# Patient Record
Sex: Male | Born: 1935 | Race: White | Hispanic: No | Marital: Married | Smoking: Former smoker
Health system: Southern US, Community
[De-identification: ages and names within clinical notes are randomized; demographics above are authoritative.]

## PROBLEM LIST (undated history)

## (undated) DIAGNOSIS — E785 Hyperlipidemia, unspecified: Secondary | ICD-10-CM

## (undated) DIAGNOSIS — A809 Acute poliomyelitis, unspecified: Secondary | ICD-10-CM

## (undated) DIAGNOSIS — I10 Essential (primary) hypertension: Secondary | ICD-10-CM

## (undated) DIAGNOSIS — I519 Heart disease, unspecified: Secondary | ICD-10-CM

## (undated) DIAGNOSIS — C4491 Basal cell carcinoma of skin, unspecified: Secondary | ICD-10-CM

## (undated) DIAGNOSIS — E538 Deficiency of other specified B group vitamins: Secondary | ICD-10-CM

## (undated) DIAGNOSIS — R7303 Prediabetes: Secondary | ICD-10-CM

## (undated) DIAGNOSIS — C189 Malignant neoplasm of colon, unspecified: Secondary | ICD-10-CM

## (undated) HISTORY — DX: Basal cell carcinoma of skin, unspecified: C44.91

## (undated) HISTORY — DX: Malignant neoplasm of colon, unspecified: C18.9

## (undated) HISTORY — PX: CARPAL TUNNEL RELEASE: SHX101

## (undated) HISTORY — DX: Essential (primary) hypertension: I10

## (undated) HISTORY — DX: Prediabetes: R73.03

## (undated) HISTORY — DX: Hyperlipidemia, unspecified: E78.5

## (undated) HISTORY — DX: Acute poliomyelitis, unspecified: A80.9

## (undated) HISTORY — DX: Deficiency of other specified B group vitamins: E53.8

## (undated) HISTORY — PX: OTHER SURGICAL HISTORY: SHX169

## (undated) HISTORY — DX: Heart disease, unspecified: I51.9

---

## 2004-12-19 HISTORY — PX: OTHER SURGICAL HISTORY: SHX169

## 2005-08-17 ENCOUNTER — Ambulatory Visit: Payer: Self-pay | Admitting: Internal Medicine

## 2005-10-11 ENCOUNTER — Encounter: Payer: Self-pay | Admitting: Internal Medicine

## 2005-10-19 ENCOUNTER — Encounter: Payer: Self-pay | Admitting: Internal Medicine

## 2005-11-18 ENCOUNTER — Encounter: Payer: Self-pay | Admitting: Internal Medicine

## 2005-12-19 ENCOUNTER — Encounter: Payer: Self-pay | Admitting: Internal Medicine

## 2006-01-19 ENCOUNTER — Encounter: Payer: Self-pay | Admitting: Internal Medicine

## 2008-05-08 ENCOUNTER — Ambulatory Visit: Payer: Self-pay | Admitting: Unknown Physician Specialty

## 2008-05-15 ENCOUNTER — Ambulatory Visit: Payer: Self-pay | Admitting: Unknown Physician Specialty

## 2008-09-18 ENCOUNTER — Ambulatory Visit: Payer: Self-pay | Admitting: Unknown Physician Specialty

## 2008-09-24 ENCOUNTER — Ambulatory Visit: Payer: Self-pay | Admitting: Unknown Physician Specialty

## 2009-08-12 ENCOUNTER — Ambulatory Visit: Payer: Self-pay | Admitting: Internal Medicine

## 2010-12-02 ENCOUNTER — Ambulatory Visit: Payer: Self-pay | Admitting: Gastroenterology

## 2010-12-06 LAB — PATHOLOGY REPORT

## 2011-04-01 ENCOUNTER — Ambulatory Visit: Payer: Self-pay | Admitting: Ophthalmology

## 2011-04-12 ENCOUNTER — Ambulatory Visit: Payer: Self-pay | Admitting: Ophthalmology

## 2011-07-26 ENCOUNTER — Ambulatory Visit: Payer: Self-pay | Admitting: Ophthalmology

## 2012-09-11 ENCOUNTER — Encounter: Payer: Self-pay | Admitting: Family Medicine

## 2012-09-18 ENCOUNTER — Encounter: Payer: Self-pay | Admitting: Family Medicine

## 2012-10-19 ENCOUNTER — Encounter: Payer: Self-pay | Admitting: Family Medicine

## 2012-11-18 ENCOUNTER — Encounter: Payer: Self-pay | Admitting: Family Medicine

## 2013-10-11 ENCOUNTER — Encounter: Payer: Self-pay | Admitting: Family Medicine

## 2013-10-19 ENCOUNTER — Encounter: Payer: Self-pay | Admitting: Family Medicine

## 2013-11-18 ENCOUNTER — Encounter: Payer: Self-pay | Admitting: Family Medicine

## 2014-01-23 ENCOUNTER — Encounter: Payer: Self-pay | Admitting: Neurology

## 2014-02-16 ENCOUNTER — Encounter: Payer: Self-pay | Admitting: Neurology

## 2014-03-28 ENCOUNTER — Encounter: Payer: Self-pay | Admitting: *Deleted

## 2014-03-31 ENCOUNTER — Other Ambulatory Visit: Payer: Self-pay | Admitting: Neurology

## 2014-03-31 ENCOUNTER — Telehealth: Payer: Self-pay | Admitting: *Deleted

## 2014-03-31 ENCOUNTER — Encounter: Payer: Self-pay | Admitting: Neurology

## 2014-03-31 ENCOUNTER — Ambulatory Visit (INDEPENDENT_AMBULATORY_CARE_PROVIDER_SITE_OTHER): Payer: Medicare Other | Admitting: Neurology

## 2014-03-31 VITALS — BP 120/66 | HR 105 | Resp 14 | Wt 144.1 lb

## 2014-03-31 DIAGNOSIS — R279 Unspecified lack of coordination: Secondary | ICD-10-CM

## 2014-03-31 DIAGNOSIS — B91 Sequelae of poliomyelitis: Secondary | ICD-10-CM

## 2014-03-31 DIAGNOSIS — G14 Postpolio syndrome: Secondary | ICD-10-CM

## 2014-03-31 DIAGNOSIS — G609 Hereditary and idiopathic neuropathy, unspecified: Secondary | ICD-10-CM

## 2014-03-31 DIAGNOSIS — G253 Myoclonus: Secondary | ICD-10-CM

## 2014-03-31 DIAGNOSIS — R278 Other lack of coordination: Secondary | ICD-10-CM

## 2014-03-31 DIAGNOSIS — G20 Parkinson's disease: Secondary | ICD-10-CM

## 2014-03-31 LAB — MAGNESIUM: Magnesium: 2 mg/dL (ref 1.5–2.5)

## 2014-03-31 LAB — C-REACTIVE PROTEIN: CRP: 1.8 mg/dL — ABNORMAL HIGH (ref <0.60)

## 2014-03-31 NOTE — Telephone Encounter (Signed)
Pt's wife called back to let me know that pt did have blood drawn.  The lab called me back about another test and I requested the celiac panel.  The lab tech said that he did not have enough blood to add this test.  Do you want him to come back for that lab?

## 2014-03-31 NOTE — Telephone Encounter (Signed)
I called the lab to try to have celiac panel added but they said that they have not received specimen yet.  Attempted to contact pt to see if he had labs drawn yet and had to leave message for him to call me back.  The lab requested that I call them back tomorrow.

## 2014-03-31 NOTE — Patient Instructions (Addendum)
1.  MRI brain wwo contrast 2.  EMG of the left side 3.  Check blood work today 4.  Start sinemet as follows:  - Week 1:  One tablet three times daily  - Week 2:  1.5 tablet in the morning, 1 tablet in the evening and 1 tablet in the evening  - Week 3:  1.5 tablets in the morning, 1.5 tablet in the afternoon, and 1 tablet in the evening  - Week 4:  1.5 tablets three times daily  - Week 5:  2 tablet in the morning, 1.5 tablet in the evening and 1.5 tablet in the evening  - Week 6:  2 tablets in the morning, 2 tablet in the afternoon, and 1.5 tablet in the evening  - Week 7:  2 tablets three times daily 5.  Return 6-weeks   ELECTROMYOGRAM AND NERVE CONDUCTION STUDIES (EMG/NCS) INSTRUCTIONS  How to Prepare The neurologist conducting the EMG will need to know if you have certain medical conditions. Tell the neurologist and other EMG lab personnel if you:   Have a pacemaker or any other electrical medical device   Take blood-thinning medications   Have hemophilia, a blood-clotting disorder that causes prolonged bleeding Bathing Take a shower or bath shortly before your exam in order to remove oils from your skin. Don't apply lotions or creams before the exam.  What to Expect You'll likely be asked to change into a hospital gown for the procedure and lie down on an examination table. The following explanations can help you understand what will happen during the exam.    Electrodes. The neurologist or a technician places surface electrodes at various locations on your skin depending on where you're experiencing symptoms. Or the neurologist may insert needle electrodes at different sites depending on your symptoms.    Sensations. The electrodes will at times transmit a tiny electrical current that you may feel as a twinge or spasm. The needle electrode may cause discomfort or pain that usually ends shortly after the needle is removed. If you are concerned about discomfort or pain, you may want to  talk to the neurologist about taking a short break during the exam.    Instructions. During the needle EMG, the neurologist will assess whether there is any spontaneous electrical activity when the muscle is at rest - activity that isn't present in healthy muscle tissue - and the degree of activity when you slightly contract the muscle.  He or she will give you instructions on resting and contracting a muscle at appropriate times. Depending on what muscles and nerves the neurologist is examining, he or she may ask you to change positions during the exam.  After your EMG You may experience some temporary, minor bruising where the needle electrode was inserted into your muscle. This bruising should fade within several days. If it persists, contact your primary care doctor.      [facility]  Preventing Falls in the Home   Falls are common, often dreaded events in the lives of older people. Aside from the obvious injuries and even death that may result, falls can cause wide-ranging consequences including loss of independence, mental decline, decreased activity, and mobility. Younger people are also at risk of falling, especially those with chronic illnesses and fatigue.  Ways to reduce the risk for falling:  * Examine diet and medications. Warm foods and alcohol dilate blood vessels, which can lead to dizziness when standing. Sleep aids, antidepressants, and pain medications can also increase the  likelihood of a fall.  * Get a vison exam. Poor vision, cataracts, and glaucoma increase the chances of falling.  * Check foot gear. Shoes should fit snugly and have a sturdy, nonskid sole and broad, low heel.  * Participate in a physician-approved exercise program to build and maintain muscle strength and improve balance and coordination.  * Increase vitamin D intake. Vitamin D improves muscle strength and increases the amount of calcium the body is able to absorb and deposit in bones.  How to prevent  falls from common hazards:  * Floors - Remove all loose wires, cords, and throw rugs. Minimize clutter. Make sure rugs are anchored and smooth. Keep furniture in its usual place.  * Chairs - Use chairs with straight backs, armrests, and firm seats. Add firm cushions to existing pieces to add height.  * Bathroom - Install grab bars and non-skid tape in the tub or shower. Use a bathtub transfer bench or a shower chair with a back support. Use an elevated toilet seat and/or safety rails to assist standing from a low surface. Do not use towel racks or bathroom tissue holders to help you stand.  * Lighting - Make sure halls, stairways, and entrances are well-lit. Install a night light in your bathroom or hallway. Make sure there is a light switch at the top and bottom of the staircase. Turn lights on if you get up in the middle of the night. Make sure lamps or light switches are within reach of the bed if you have to get up during the night.  * Kitchen - Install non-skid rubber mats near the sink and stove. Clean spills immediately. Store frequently used utensils, pots, and pans between waist and eye level. This helps prevent reaching and bending. Sit when getting things out of the lower cupboards.  * Living room / [place] furniture with wide spaces in between, giving enough room to move around. Establish a route through the living room that gives you something to hold onto as you walk.  * Stairs - Make sure treads, rails, and rugs are secure. Install a rail on both sides of the stairs. If stairs are a threat, it might be helpful to arrange most of your activities on the lower level to reduce the number of times you must climb the stairs.  * Entrances and doorways - Install metal handles on the walls adjacent to the doorknobs of all doors to make it more secure as you travel through the doorway.  Tips for maintaining balance:  * Keep at least one hand free at all times Try using a backpack or fanny  pack to hold things rather than carrying them in your hands. Never carry objects in both hands when walking as this interferes with keeping your balance.  * Attempt to swing both arms from front to back while walking. This might require a conscious effort if Parkinson's disease has diminished your movement. It will, however, help you to maintain balance and posture, and reduce fatigue.  * Consciously lift your feet off the ground when walking. Shuffling and dragging of the feet is a common culprit in losing your balance.  * When trying to navigate turns, use a "U" technique of facing forward and making a wide turn, rather than pivoting sharply.  Earlean Polka stand with your feet shoulder-length apart. When your feet are close together for any length of time, you increase your risk of losing your balance and falling.  * Do one  thing at a time. Do not try to walk and accomplish another task, such as reading or looking around. The decrease in your automatic reflexes complicates motor function, so the less distraction, the better.  * Do not wear rubber or gripping soled shoes, they might "catch" on the floor and cause tripping.  * Move slowly when changing positions. Use deliberate, concentrated movements and, if needed, use a grab bar or walking aid. Count fifteen (15) seconds after standing to begin walking.  * If balance is a continuous problem, you might want to consider a walking aid such as a cane, walking stick, or walker. Once you have mastered walking with help, you may be ready to try it again on your own.  This information is provided by [facility] and is not intended to replace the medical advice of your physician or other health care providers. Please consult your physician or other health care providers for advice regarding your specific medical condition.

## 2014-03-31 NOTE — Progress Notes (Signed)
[facility] Clinic Note - Initial Visit   Date: 03/31/2014    Richard Blair MRN: [id number] DOB: 03-19-1926   Dear Dr Melrose Nakayama:  Thank you for your kind referral of Richard Blair for consultation of gait difficulty and leg weakness. Although his history is well known to you, please allow [place] to reiterate it for the purpose of our medical record. The patient was accompanied to the clinic by wife and daughter who also provides collateral information.     History of Present Illness: Richard Blair is a 80 y.o. right-handed Caucasian male with history of poliomyelitis with residual left leg weakness, pre-diabetes, hypertension, hyperlipidemia (off statins due to intolerance), CAD s/p CABG x 3 (2006), basal cell carcinoma scalp s/p resection, and colon cancer s/p resection (1984) presenting for evaluation of bilateral leg weakness.    He reports having polio involving the left leg when very he was very young child.  He does not recall what age, but says that he wore a cast intermittently and underwent ankle surgery at the age of 37 for ?foot inversion.  He was eventually able to stop wearing a brace, but always had atrophy and weakness of the left leg. Despite his weakness, he he played football in high school.  He was reportedly doing well until 2011 when he developed left foot drop and needed a left leg brace. In 2013, he started using a cane due to problems with balance, gait, and increased falls.  He did physical therapy in 2013 which helped, again in fall 2014 and early 2015, but due to no improvement, he was discharged.  He was active until last year where he was mowing the lawn with a push mower, but since then has become more sedentary due to gait instability. He is currently ambulating with a wheel chair and feels stable when using it but is very unsteady if he does not have support.  He does not report having falls because of leg buckling, but more so feeling of  unsteadiness.  Denies muscle stiffness, twitches, cramps, pain, or numbness/tingling.   He has fallen once this year, but when symptoms were severe fell 4 times in 6-days.  Most of the time when he falls, he falls backwards.  He cannot stand up from falls and would have to call for help or go to furniture.  No preceding lightheadedness, changes in vision, palpitations, or warning symptoms.    Upon further questioning, he has slowed movements, speech is softer and slightly slurred, intermittent chocking spells with solids and liquids.  There is no changes in handwriting, tremor, constipation, changes in small/taste, hallucinations, vivid creams, or acting out of his dreams.    He has been followed by Dr. Melrose Nakayama and was given a trial of sinemet 25/100 three times per day for one week, but due to no change in symptoms it was stopped.  He is referred to me for further evaluation of leg weakness.    Out-side paper records, electronic medical record, and images have been reviewed where available and summarized as:  US carotids 04/07/2014:  No hemodynamically significant stenosis Labs 09/04/2013:  B12 407 Labs 01/02/2014:  HbA1c 6.3, Cr 0.9, Na 137, AST 14, ALT 12 Labs 01/15/2014:  CK 76, ESR 38, MG panel - neg   Past Medical History  Diagnosis Date  . Polio   . Colon cancer   . Heart disease   . Borderline diabetes   . Hypertension   . Hyperlipidemia   .  Vitamin B12 deficiency   . Basal cell carcinoma     Past Surgical History  Procedure Laterality Date  . Colon cancer surgery    . Triple bypass  2006  . Carpal tunnel release       Medications:  Current Outpatient Prescriptions on File Prior to Visit  Medication Sig Dispense Refill  . aspirin 81 MG tablet Take 81 mg by mouth daily.      . Cyanocobalamin 1000 MCG/ML KIT Inject 1 mg as directed every 30 (thirty) days.      . fluticasone (FLONASE) 50 MCG/ACT nasal spray Place 2 sprays into both nostrils daily.      Marland Kitchen lisinopril  (PRINIVIL,ZESTRIL) 10 MG tablet Take 10 mg by mouth daily.       No current facility-administered medications on file prior to visit.    Allergies:  Allergies  Allergen Reactions  . Aleve [Naproxen Sodium]   . Crestor [Rosuvastatin]   . Lescol [Fluvastatin Sodium]   . Lipitor [Atorvastatin]     Family History: Family History  Problem Relation Age of Onset  . Alzheimer's disease Mother     Deceased, 94  . COPD Father     Deceased, 20  . Dementia Sister     Living, 69  . Heart attack Brother     Deceased  . Diverticulitis Brother     Deceased    Social History: History   Social History  . Marital Status: Married    Spouse Name: N/A    Number of Children: N/A  . Years of Education: N/A   Occupational History  . retired    Social History Main Topics  . Smoking status: Former Smoker -- 1.00 packs/day for 10 years    Types: Cigarettes  . Smokeless tobacco: Not on file     Comment: Quit 40+ years  . Alcohol Use: No  . Drug Use: No  . Sexual Activity: Not on file   Other Topics Concern  . Not on file   Social History Narrative   Lives with wife in a [place] home.  He has three grown children.   Retired as a Gaffer level of education:  High school             Review of Systems:  CONSTITUTIONAL: No fevers, chills, night sweats, or weight loss.   EYES: No visual changes or eye pain ENT: No hearing changes.  No history of nose bleeds.   RESPIRATORY: No cough, wheezing + shortness of breath.   CARDIOVASCULAR: Negative for chest pain, and palpitations.   GI: Negative for abdominal discomfort, blood in stools or black stools.  No recent change in bowel habits.   GU:  No history of incontinence.   MUSCLOSKELETAL: No history of joint pain or swelling.  No myalgias.   SKIN: Negative for lesions, rash, and itching.   HEMATOLOGY/ONCOLOGY: Negative for prolonged bleeding, bruising easily, and swollen nodes.  + history of cancer.     ENDOCRINE: Negative for cold or heat intolerance, polydipsia or goiter.   PSYCH:  No depression or anxiety symptoms.   NEURO: As Above.   Vital Signs:  BP 120/66  Pulse 105  Wt 144 lb 1 oz (65.346 kg)  SpO2 96% Pain Scale: 0 on a scale of 0-10   General Medical Exam:   General:  Somewhat blunted affect, while sitting on the examining table he is leaning back and holding onto the table for support Eyes/ENT: see cranial nerve  examination.   Neck: No masses appreciated.  No carotid bruits. Respiratory:  Clear to auscultation, good air entry bilaterally.   Cardiac:  Regular rate and rhythm, no murmur.   Back:  No pain to palpation of spinous processes.   Extremities:  Left leg atrophy.  No edema.   Skin:  Severe onychomycoses of the feet. No rashes seen, although several areas of skin discoloration over the face from previous surgeries for basal cell carcinoma    Neurological Exam: MENTAL STATUS including orientation to time, place, person, recent and remote memory, attention span and concentration, language, and fund of knowledge is fairly intact.  Speech is slightly dysarthric and hypophonic. He has moderate difficulty with enunciating guttural sounds effectively.   CRANIAL NERVES: II:  No visual field defects.  Unremarkable fundi.   III-IV-VI: Pupils equal round and reactive to light.  Square wave jerks are present bilaterally.  Vertical gaze especially with upward is restricted bilaterally and horizontal gaze is intact. Pursuit is slightly jerky and there is evidence of overshoot with saccadic eye movements. There is no ptosis. V:  Normal facial sensation.  Jaw jerk is absent.   VII:  Normal facial symmetry and movements.  Left palmomental is present and Myerson sign is positive. There is no snout.  VIII:  Normal hearing. IX-X:  Palate elevates symmetrically, however there is prominent palatal myoclonus with audible clicking  XI:  Normal shoulder shrug and head rotation.   XII:   Tongue strength is slightly weak and tongue movements are markedly slowed. There is no evidence of tongue fasciculations.  MOTOR:  Prominent atrophy of the left lower extremity especially below the knee. Rare left vastus medialis fasciculation was seen. There is no tremor. He has a difficult time relaxing however I do believe that there is an increase in tone of the upper extremities which is worse with distraction maneuvers.  Right Upper Extremity:    Left Upper Extremity:    Deltoid  5/5   Deltoid  5/5   Biceps  5/5   Biceps  5/5   Triceps  5/5   Triceps  5/5   Wrist extensors  5/5   Wrist extensors  5/5   Wrist flexors  5/5   Wrist flexors  5/5   Finger extensors  5/5   Finger extensors  5/5   Finger flexors  5/5   Finger flexors  5/5   Dorsal interossei  5/5   Dorsal interossei  5/5   Abductor pollicis  5/5   Abductor pollicis  5/5   Tone (Ashworth scale)  1  Tone (Ashworth scale)  1   Right Lower Extremity:    Left Lower Extremity:    Hip flexors  5-/5   Hip flexors  4/5   Hip extensors  5/5   Hip extensors  5-/5   Abductor  5/5  Abductor  5-/5  Adductor  5/5  Adductor 4+/5  Knee flexors  5/5   Knee flexors  4/5   Knee extensors  5/5   Knee extensors  4+/5   Dorsiflexors  5/5   Dorsiflexors  1/5   Plantarflexors  5/5   Plantarflexors  3/5   Toe extensors  5/5   Toe extensors  0/5   Toe flexors  5/5   Toe flexors  1/5   Tone (Ashworth scale)  0  Tone (Ashworth scale)  0   MSRs:  Right  Left brachioradialis 3+  brachioradialis 3+  Biceps 2+  biceps 2+  triceps 2+  triceps 2+  patellar 1+  patellar 0  ankle jerk trace  ankle jerk 0  Hoffman no  Hoffman no  plantar response down  plantar response down   SENSORY: Pin prick and temperature is reduced distal to the knees bilaterally. Vibration is absent at the knees and distally but intact at the MCP. Proprioception is impaired at the great toe bilaterally.     COORDINATION/GAIT: There is evidence of mild ataxia with finger to nose testing and circumduction of the arms especially on the left. Finger tapping and heel tapping shows slowed movements with decrement. He is unable to rise from a chair without using arms.  Gait is severely ataxic with evidence of truncal ataxia and requiring two-person assist. Steps are small and he is quickly out of breath within 15 feet of walking.    IMPRESSION: Richard Blair is 80 year old gentleman presenting for evaluation of gait abnormality, weakness, and falls. This is a very complex and interesting case with several notable exam findings including: Palatal myoclonus, square wave jerks, increased tone in the upper extremities, dysarthria, distal and symmetric peripheral neuropathy, marked peripheral and axial ataxia, neuropathy and known left leg weakness from history of polio.  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  He has findings suggestive of peripheral neuropathy, central ataxia, and parkinsonian features. The differential is broad and includes neurodegenerative conditions such as Parkinson plus syndromes (progressive supranuclear palsy and other tauopathies), structural lesion involving the Mollaret triangle (cerebellum/pons), toxic-metabolic/nutritional/autoimmune and less likely paraneoplastic processes.    I would like to initiate a extensive workup to better localize and characterize his symptoms. MRI brain will be obtained with and without contrast, EMG of the left side, as well as laboratory testing.  As I continue to work-up symptome, I will optimize his Sinemet to see there is an underlying is a parkinson-plus syndrome.  For post-polio syndrome, management is symptomatic and he reportedly did have improvement with his first cycle of PT in 2013, but no  improvement with his recent session which was completed in March, which further suggests a secondary process going on.    PLAN/RECOMMENDATIONS:  1.  MRI brain wwo contrast 2.  Check copper, ceruloplasmin, CRP, RPR, folate, vitamin E, SPEP/UPEP with IFE, vitamin B6, Mg, heavy metal, celiac panel,  3.  EMG of the left side 4.  Restart Sinemet 25/100 1 tablet 3 times a day and titrate to 2 tablets 3 times a day. Titration schedule has been provided. 5.  Literature on fall precautions provided.  I encouraged him to use his Rolator and continue home exercises. 6.  Return to clinic in 6-weeks   The duration of this appointment visit was 75 minutes of face-to-face time with the patient.  Greater than 50% of this time was spent in counseling, explanation of diagnosis, planning of further management, and coordination of care.   Thank you for allowing me to participate in patient's care.  If I can answer any additional questions, I would be pleased to do so.    Sincerely,    Donika K. Posey Pronto, DO

## 2014-04-01 LAB — RPR

## 2014-04-01 LAB — FOLATE: Folate: 15.4 ng/mL

## 2014-04-01 LAB — TSH: TSH: 4.127 u[IU]/mL (ref 0.350–4.500)

## 2014-04-01 NOTE — Telephone Encounter (Signed)
I spoke with Dr. Posey Pronto and she said it was ok to wait and have lab done when patient returns.

## 2014-04-02 DIAGNOSIS — G14 Postpolio syndrome: Secondary | ICD-10-CM | POA: Insufficient documentation

## 2014-04-02 DIAGNOSIS — G609 Hereditary and idiopathic neuropathy, unspecified: Secondary | ICD-10-CM | POA: Insufficient documentation

## 2014-04-02 DIAGNOSIS — R278 Other lack of coordination: Secondary | ICD-10-CM | POA: Insufficient documentation

## 2014-04-02 DIAGNOSIS — G253 Myoclonus: Secondary | ICD-10-CM | POA: Insufficient documentation

## 2014-04-02 LAB — UIFE/LIGHT CHAINS/TP QN, 24-HR UR
ALBUMIN, U: DETECTED
FREE KAPPA/LAMBDA RATIO: 19.92 ratio — AB (ref 2.04–10.37)
FREE LAMBDA LT CHAINS, UR: 0.12 mg/dL (ref 0.02–0.67)
Free Kappa Lt Chains,Ur: 2.39 mg/dL (ref 0.14–2.42)
TOTAL PROTEIN, URINE-UPE24: 4 mg/dL

## 2014-04-02 LAB — CERULOPLASMIN: CERULOPLASMIN: 25 mg/dL (ref 18–36)

## 2014-04-02 NOTE — Progress Notes (Signed)
Note faxed.

## 2014-04-03 LAB — SPEP & IFE WITH QIG
ALBUMIN ELP: 56.5 % (ref 55.8–66.1)
ALPHA-2-GLOBULIN: 11.6 % (ref 7.1–11.8)
Alpha-1-Globulin: 5.8 % — ABNORMAL HIGH (ref 2.9–4.9)
BETA 2: 4.6 % (ref 3.2–6.5)
BETA GLOBULIN: 5.8 % (ref 4.7–7.2)
Gamma Globulin: 15.7 % (ref 11.1–18.8)
IgA: 247 mg/dL (ref 68–379)
IgG (Immunoglobin G), Serum: 1160 mg/dL (ref 650–1600)
IgM, Serum: 35 mg/dL — ABNORMAL LOW (ref 41–251)
TOTAL PROTEIN, SERUM ELECTROPHOR: 6.7 g/dL (ref 6.0–8.3)

## 2014-04-03 LAB — COPPER, SERUM: Copper: 91 ug/dL (ref 70–175)

## 2014-04-04 LAB — VITAMIN E
GAMMA-TOCOPHEROL (VIT E): 2.2 mg/L (ref <=4.3)
VITAMIN E (ALPHA TOCOPHEROL): 9.1 mg/L (ref 5.7–19.9)

## 2014-04-05 LAB — VITAMIN B6: VITAMIN B6: 7.7 ng/mL (ref 2.1–21.7)

## 2014-04-07 NOTE — Telephone Encounter (Signed)
abc

## 2014-04-09 ENCOUNTER — Other Ambulatory Visit: Payer: Self-pay | Admitting: *Deleted

## 2014-04-09 DIAGNOSIS — G609 Hereditary and idiopathic neuropathy, unspecified: Secondary | ICD-10-CM

## 2014-04-11 ENCOUNTER — Ambulatory Visit (HOSPITAL_COMMUNITY)
Admission: RE | Admit: 2014-04-11 | Discharge: 2014-04-11 | Disposition: A | Discharge status: Home / Self Care | Payer: Medicare Other | Source: Ambulatory Visit | Attending: Neurology | Admitting: Neurology

## 2014-04-11 DIAGNOSIS — R42 Dizziness and giddiness: Secondary | ICD-10-CM | POA: Insufficient documentation

## 2014-04-11 DIAGNOSIS — G319 Degenerative disease of nervous system, unspecified: Secondary | ICD-10-CM | POA: Insufficient documentation

## 2014-04-11 DIAGNOSIS — I6529 Occlusion and stenosis of unspecified carotid artery: Secondary | ICD-10-CM | POA: Insufficient documentation

## 2014-04-11 DIAGNOSIS — G609 Hereditary and idiopathic neuropathy, unspecified: Secondary | ICD-10-CM

## 2014-04-11 DIAGNOSIS — G253 Myoclonus: Secondary | ICD-10-CM | POA: Insufficient documentation

## 2014-04-11 DIAGNOSIS — I739 Peripheral vascular disease, unspecified: Secondary | ICD-10-CM | POA: Insufficient documentation

## 2014-04-11 DIAGNOSIS — R279 Unspecified lack of coordination: Secondary | ICD-10-CM | POA: Insufficient documentation

## 2014-04-11 DIAGNOSIS — R4789 Other speech disturbances: Secondary | ICD-10-CM | POA: Insufficient documentation

## 2014-04-11 LAB — CREATININE, SERUM
Creatinine, Ser: 0.84 mg/dL (ref 0.50–1.35)
GFR calc non Af Amer: 77 mL/min — ABNORMAL LOW (ref >90)
GFR, EST AFRICAN AMERICAN: 89 mL/min — AB (ref >90)

## 2014-04-11 LAB — HEAVY METALS SCREEN, URINE
ARSENIC 24H UR: 3
Lead, Urine (24 Hr): 1
Mercury 24 Hr Urine: <2

## 2014-04-11 MED ORDER — GADOBENATE DIMEGLUMINE 529 MG/ML IV SOLN
15.0000 mL | Freq: Once | INTRAVENOUS | Status: AC
  Administered 2014-04-11: 15 mL via INTRAVENOUS

## 2014-04-30 ENCOUNTER — Encounter: Payer: Self-pay | Admitting: Neurology

## 2014-05-01 ENCOUNTER — Telehealth: Payer: Self-pay | Admitting: Neurology

## 2014-05-01 NOTE — Telephone Encounter (Signed)
Called and discussed results of labs and MRI/A brain.  He has significant intracranial atherosclerosis.  He is already taking aspirin and has not tolerated statins in the past.  He is seeing his PCP for hyperlipidemia. All questions were answered.  Donika K. Posey Pronto, DO

## 2014-05-13 ENCOUNTER — Telehealth: Payer: Self-pay | Admitting: Neurology

## 2014-05-13 MED ORDER — CARBIDOPA-LEVODOPA 25-100 MG PO TABS: 2.0000 | ORAL_TABLET | Freq: Three times a day (TID) | ORAL | Status: DC

## 2014-05-13 NOTE — Telephone Encounter (Signed)
Patient's wife notified.  Patient has follow up appointment on June 4.

## 2014-05-13 NOTE — Telephone Encounter (Signed)
Please notify pt that sinemet 25/100 2 tab three times daily has been sent (per faxed rx request).  I would like to see him again before changing the dose of the medication.  Chirag Krueger K. Posey Pronto, DO

## 2014-05-22 ENCOUNTER — Telehealth: Payer: Self-pay | Admitting: Neurology

## 2014-05-22 ENCOUNTER — Ambulatory Visit (INDEPENDENT_AMBULATORY_CARE_PROVIDER_SITE_OTHER): Payer: Medicare Other | Admitting: Neurology

## 2014-05-22 ENCOUNTER — Encounter: Payer: Self-pay | Admitting: Neurology

## 2014-05-22 VITALS — BP 104/60 | HR 84 | Resp 14 | Ht 64.0 in | Wt 143.0 lb

## 2014-05-22 DIAGNOSIS — B91 Sequelae of poliomyelitis: Secondary | ICD-10-CM

## 2014-05-22 DIAGNOSIS — G238 Other specified degenerative diseases of basal ganglia: Secondary | ICD-10-CM

## 2014-05-22 DIAGNOSIS — G14 Postpolio syndrome: Secondary | ICD-10-CM

## 2014-05-22 DIAGNOSIS — G20C Parkinsonism, unspecified: Secondary | ICD-10-CM

## 2014-05-22 DIAGNOSIS — G232 Striatonigral degeneration: Secondary | ICD-10-CM

## 2014-05-22 NOTE — Patient Instructions (Addendum)
1.  Continue sinemet 25/100mg  2 tab twice daily 2.  EMG scheduled on 06/05/2014 at 3pm.   Please arrive 30-min prior to appointment. 3.  Return to clinic in 6 weeks

## 2014-05-22 NOTE — Telephone Encounter (Signed)
Sinimet 25/100 2 tablets three times daily.  Please convey that I apologize for confusion.  Donika K. Posey Pronto, DO

## 2014-05-22 NOTE — Telephone Encounter (Signed)
Richard Blair called and said that your instructions say to take Sinemet bid but he had worked up to tid.  Does he need to stay on it tid?  Please advise.  Thanks.

## 2014-05-22 NOTE — Progress Notes (Signed)
Follow-up Visit   Date: 05/22/2014    Richard Blair MRN: [id number] DOB: [birth year]   Interim History: Richard Blair is a 80 y.o. right-handed Caucasian male with history of history of poliomyelitis with residual left leg weakness, pre-diabetes, hypertension, hyperlipidemia (off statins due to intolerance), CAD s/p CABG x 3 (2006), basal cell carcinoma scalp s/p resection, and colon cancer s/p resection (1984) returning to the clinic for follow-up of gait ataxia and leg weakness.  The patient was accompanied to the clinic by daughter and wife who also provides collateral information.    History of present illness: He reports having polio involving the left leg when very he was very young child. He does not recall what age, but says that he wore a cast intermittently and underwent ankle surgery at the age of 64 for ?foot inversion. He was eventually able to stop wearing a brace, but always had atrophy and weakness of the left leg. Despite his weakness, he he played football in high school.   He was doing well until 2011 when he developed left foot drop and needed a left leg brace. In 2013, he started using a cane due to problems with balance, gait, and increased falls. He did physical therapy in 2013 which helped, again in fall 2014 and early 2015, but due to no improvement, he was discharged. He was active until last year where he was mowing the lawn with a push mower, but since then has become more sedentary due to gait instability. He is currently ambulating with a rollator and feels stable when using it but is very unsteady if he does not have support.  He has fallen once this year, but when symptoms were severe fell 4 times in 6-days. Most of the time when he falls, he falls backwards. He cannot stand up from falls and would have to call for help or go to furniture. No preceding lightheadedness, changes in vision, palpitations, or warning symptoms.   Upon further questioning, he has slowed  movements, speech is softer and slightly slurred, intermittent chocking spells with solids and liquids. There is no changes in handwriting, tremor, constipation, changes in small/taste, hallucinations, vivid creams, or acting out of his dreams.   He has been followed by Dr. Melrose Nakayama and was given a trial of sinemet 25/100 three times per day for one week, but due to no change in symptoms it was stopped.   - Follow-up 05/22/2014:  At his last visit sinemet 25/100 was started and he recently increased it to 2 tablets twice daily. Richard Blair does not notice any significant difference, but his daughter and wife say that it has helped.  For instance, he fell forwards trying to pick something up off the floor and family say that he did not require as much help to get up as before.  Since March, he has only fallen twice which is improved.  He feels that his gait is more stable after using the walker and being more cautious.     Medications:  Current Outpatient Prescriptions on File Prior to Visit  Medication Sig Dispense Refill  . aspirin 81 MG tablet Take 81 mg by mouth daily.      . carbidopa-levodopa (SINEMET IR) 25-100 MG per tablet Take 2 tablets by mouth 3 (three) times daily.  200 tablet  5  . fluticasone (FLONASE) 50 MCG/ACT nasal spray Place 2 sprays into both nostrils daily.      Marland Kitchen lisinopril (PRINIVIL,ZESTRIL) 10 MG tablet Take  10 mg by mouth daily.       No current facility-administered medications on file prior to visit.    Allergies:  Allergies  Allergen Reactions  . Aleve [Naproxen Sodium]   . Crestor [Rosuvastatin]   . Lescol [Fluvastatin Sodium]   . Lipitor [Atorvastatin]      Review of Systems:  CONSTITUTIONAL: No fevers, chills, night sweats, or weight loss.   EYES: No visual changes or eye pain ENT: No hearing changes.  No history of nose bleeds.   RESPIRATORY: No cough, wheezing and shortness of breath.   CARDIOVASCULAR: Negative for chest pain, and palpitations.   GI:  Negative for abdominal discomfort, blood in stools or black stools.  No recent change in bowel habits.   GU:  No history of incontinence.   MUSCLOSKELETAL: No history of joint pain or swelling.  No myalgias.   SKIN: Negative for lesions, rash, and itching.   ENDOCRINE: Negative for cold or heat intolerance, polydipsia or goiter.   PSYCH:  No depression or anxiety symptoms.   NEURO: As Above.   Vital Signs:  BP 104/60  Pulse 84  Resp 14  Ht _0  (1.626 m)  Wt 143 lb (64.864 kg)  BMI 24.53 kg/m2  Neurological Exam: MENTAL STATUS including orientation to time, place, person, recent and remote memory, attention span and concentration, language, and fund of knowledge is normal.  Speech is not dysarthric.  CRANIAL NERVES: No visual field defects. Pupils equal round and reactive to light. Square wave jerks are absent today. Vertical gaze paresis bilaterally especially with upward gaze.  Horizontal gaze is intact. Pursuit is slightly jerky and there is evidence of overshoot with saccadic eye movements. There is no ptosis. Face is symmetric. Palate elevates symmetrically.  Tongue is midline.  Tongue movements are moderately slowed and there is slight weakness.  No tongue fasciculations. Left palmomental is present and Myerson sign is positive. There is no snout.   MOTOR:  Motor strength is 5/5 upper extremities, see below table for lower extremities (hip flexion is improved).  Prominent atrophy of the left lower extremity especially below the knee. Tone is normal (improved).  Right Lower Extremity:    Left Lower Extremity:    Hip flexors  5/5   Hip flexors  5/5   Hip extensors  5/5   Hip extensors  5-/5   Abductor  5/5   Abductor  5/5   Adductor  5/5   Adductor  5/5   Knee flexors  5/5   Knee flexors  4+/5   Knee extensors  5/5   Knee extensors  5-/5   Dorsiflexors  5/5   Dorsiflexors  1/5   Plantarflexors  5/5   Plantarflexors  3/5   Toe extensors  5/5   Toe extensors  0/5   Toe flexors   5/5   Toe flexors  1/5   Tone (Ashworth scale)  0   Tone (Ashworth scale)  0     MSRs:  Right      Left  brachioradialis  3+   brachioradialis  3+   Biceps  2+   biceps  2+   triceps  2+   triceps  2+   patellar  1+   patellar  0   ankle jerk  trace   ankle jerk  0   Hoffman  no   Hoffman  no   plantar response  down   plantar response  down    SENSORY: Pin prick and temperature  is reduced distal to the knees bilaterally. Vibration is absent at the knees and distally but intact at the MCP. Proprioception is impaired at the great toe bilaterally.   COORDINATION/GAIT: Minimal dysmetria with finger to nose testing (improved). Finger tapping and heel tapping shows slowed movements with decrement. He is unable to rise from a chair without using arms and it takes several attempts. Gait is moderately ataxic and assisted with rollator.  There is no trunal ataxia and he is able to get up by himself (previously required two-person assist).  He veers to the right when walking, small steps, no shuffling, walks faster than would be expected for the degree of instability.   Data: Labs 09/04/2013: B12 407  Labs 01/02/2014: HbA1c 6.3, Cr 0.9, Na 137, AST 14, ALT 12  Chol 143  TH 75  LDL 90.9, HDL 37 Labs 01/15/2014: CK 76, ESR 38, MG panel - neg Labs 04/02/2014:  Heavy metal screen neg, Mg 2.0, vitamin B6 7.7, copper 91, ceruloplasmin 25, RPR neg, CRP 1.8, folate 15.4, vitamin E 2.2, SPEP/UPEP with IFE no M protein, TSH 4.127  US carotids 04/07/2014: No hemodynamically significant stenosis   MRI/A brain wwo contrast 04/11/2014:  Atrophy and small vessel disease. No acute intracranial findings. No abnormal postcontrast enhancement. Potentially flow reducing 75% supraclinoid left ICA stenosis. No evidence for vertebrobasilar insufficiency.    IMPRESSION/PLAN: Mr. Kinker is 80 year old gentleman with history of polio affecting the left lower leg returns for follow-up of gait abnormality, weakness, and falls. His  examination today shows marked improvement in truncal ataxia, absence of square wave jerks, improved dysmetria, and improved upper extremity tone. Proximal motor strength is also improved.  He continues to have palatal myotonus, soft voice, peripheral sensory loss, and gait ataxia.  He underwent extensive work-up including MRI/A brain as well as serological testing which has largely been non-diagnostic. He has LICA supraclinoid stenosis and I recommended that he continue aspirin 63m.  His LDL is 90 and has history of statin intolerance, so is current not on medications.  Although he denies significant improvement, his clinical exam shows marked improvement after starting sinemet.  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    At this time, I recommend continuing his current dose of sinemet 25/100 2 tablets three times daily.  To determine the degree to which neuropathy may be contributing to his ataxia, EMG has been ordered. I also encouraged him to continue home exercises, specifically doing Big and Loud exercises.  I would like him to restart therapy now that sinemet has shown some clinical improvement.  Discussed obtaining baseline MBS for symptoms of dysphagia, but patient declined.  I will see him back in 170-monthor sooner as needed   The duration of this appointment visit was 30 minutes of face-to-face time with the patient.  Greater than 50% of this time was spent in counseling, explanation of diagnosis, planning of further management, and coordination of care.   Thank you for allowing me to participate in patient's care.  If I can answer any additional questions, I would be pleased to do so.    Sincerely,    Quentavious Rittenhouse K. PaPosey ProntoDO

## 2014-05-22 NOTE — Telephone Encounter (Signed)
Pt's spouse called requesting to speak to a nurse regarding his meds instructions.

## 2014-05-23 NOTE — Telephone Encounter (Signed)
Done

## 2014-06-05 ENCOUNTER — Ambulatory Visit (INDEPENDENT_AMBULATORY_CARE_PROVIDER_SITE_OTHER): Payer: Medicare Other | Admitting: Neurology

## 2014-06-05 DIAGNOSIS — G253 Myoclonus: Secondary | ICD-10-CM

## 2014-06-05 NOTE — Procedures (Signed)
[facility]  [address], [address]  [place], [place] [address] Tel: [phone] Fax:  [phone] Test Date:  06/05/2014  Patient: Richard Blair DOB: 03/08/2026 Physician: Narda Amber, DO  Sex: Male Height: 5' " Ref Phys: Narda Amber  ID#: [id number] Temp: 343.0C Technician: Laureen Ochs R. NCS T.   Patient Complaints: Patient is an 80 year old male with history of poliomyelitis affecting the left leg here for evaluation of gait ataxia and falls.  NCV & EMG Findings: Extensive electrodiagnostic testing of the right upper and lower extremity with additional studies of the left reveals:  1. In the arms, the median and radial sensory responses show reduced amplitude (R12.9, R13.8 V). The ulnar sensory response is normal.  The ulnar and median motor responses are within normal limits. 2. In the legs, the sural and superficial peroneal sensory responses are absent bilaterally. The peroneal motor response is absent at the extensor digitorum brevis and barely present on the left and reduced on the right. Tibial motor responses are preserved bilaterally.  3. In the right lower extremity, chronic motor axon loss changes are seen affecting all the muscles of the lower extremity.  4. In the left lower extremity, no motor unit were seen despite maximal activation in the tibialis anterior, medial gastrocnemius, flexor digitorum longus, and gluteus medius. Chronic motor axonal loss changes were seen affecting the rectus femoris muscle without accompanied active denervation. In particular, giant motor unit potentials are not seen. 5. In the right upper extremity, chronic motor axonal loss changes are seen involving the distal hand muscles without active denervation.  Impression: 1. There is electrophysiologic evidence of a generalized sensorimotor polyneuropathy, axon loss in type, affecting the right upper and lower extremity, conforming to a gradient pattern. Overall, these findings are  moderately severe in degree electrically. 2. Severe chronic motor axon loss changes are seen affecting the left lower extremity suggestive of an intraspinal canal lesion (anterior horn cell, radiculopathy, etc) affecting the L3-S1 nerve roots/segments.  These findings are most likely reflect sequela of known poliomyelitis. 3. A superimposed multilevel intraspinal canal lesion process affecting the right L3-L5 nerve roots/segments cannot be excluded.   ___________________________ Narda Amber, DO    Nerve Conduction Studies Anti Sensory Summary Table   Site NR Peak (ms) Norm Peak (ms) P-T Amp (V) Norm P-T Amp  Right Median Anti Sensory (2nd Digit)  Wrist    3.5 <3.7 12.9 >20  Right Radial Anti Sensory (Base 1st Digit)  Wrist    2.3 <2.7 13.8 >20  Left Sup Peroneal Anti Sensory (Ant Lat Mall)  12 cm NR  <4  >6  Right Sup Peroneal Anti Sensory (Ant Lat Mall)  12 cm NR  <4  >6  Left Sural Anti Sensory (Lat Mall)  Calf NR  <4.4  >6  Right Sural Anti Sensory (Lat Mall)  Calf NR  <4.4  >6  Right Ulnar Anti Sensory (5th Digit)  Wrist    2.9 <3.5 17.6 >10   Motor Summary Table   Site NR Onset (ms) Norm Onset (ms) O-P Amp (mV) Norm O-P Amp Site1 Site2 Delta-0 (ms) Dist (cm) Vel (m/s) Norm Vel (m/s)  Right Median Motor (Abd Poll Brev)  Wrist    4.0 <4.4 9.7 >4 Elbow Wrist 4.6 27.0 59 >49  Elbow    8.6  9.3         Left Peroneal Motor (Ext Dig Brev)  Ankle NR  <5.5  >2  Right Peroneal Motor (Ext Dig Brev)  Ankle NR  <5.5  >2        Left Peroneal TA Motor (Tib Ant)  Fib Head    4.1 <4.2 0.4  Poplit Fib Head 2.5 10.0 40 >40.5  Poplit    6.6 <5.7 0.3         Right Peroneal TA Motor (Tib Ant)  Fib Head    3.3 <4.2 2.3  Poplit Fib Head 1.4 8.5 61 >40.5  Poplit    4.7 <5.7 2.3         Left Tibial Motor (Abd Hall Brev)  Ankle    5.7 <6.1 7.8 >3.0 Knee Ankle 9.3 35.0 38 >41  Knee    15.0  4.5         Right Tibial Motor (Abd Hall Brev)  Ankle    4.5 <6.1 4.5 >3.0 Knee Ankle 7.1  36.0 51 >41  Knee    11.6  3.1         Right Ulnar Motor (Abd Dig Minimi)  Wrist    2.7 <3.5 8.8 >6 B Elbow Wrist 4.1 24.0 59 >49  B Elbow    6.8  8.8  A Elbow B Elbow 1.9 10.0 53 >49  A Elbow    8.7  8.5  Axilla A Elbow 5.1 0.0     EMG   Side Muscle Ins Act Fibs Psw Fasc Number Recrt Dur Dur. Amp Amp. Poly Poly. Comment  Left AntTibialis Nml Nml Nml Nml None None - - - - - - N/A  Left Gastroc Nml Nml Nml Nml None None - - - - - - N/A  Left Flex Dig Long Nml Nml Nml Nml None None - - - - - - N/A  Left RectFemoris Nml Nml Nml Nml 3- Rapid Many 1+ Many 1+ Nml Nml N/A  Left GluteusMed Nml Nml Nml Nml [facility] None - - - - - - N/A  Right AntTibialis Nml Nml Nml Nml 2- Rapid Many 1+ Many 1+ Nml Nml N/A  Right Gastroc Nml Nml Nml Nml 1- Mod-V Few 1+ Nml Nml Nml Nml N/A  Right Flex Dig Long Nml Nml Nml Nml 2- Rapid Many 1+ Many 1+ Nml Nml N/A  Right RectFemoris Nml Nml Nml Nml 1- Mod-R Some 1+ Nml Nml Nml Nml N/A  Right GluteusMed Nml Nml Nml Nml 1- Mod-R Many 1+ Nml Nml Nml Nml N/A  Right 1stDorInt Nml Nml Nml Nml 1- Rapid Some 1+ Some 1+ Nml Nml N/A  Right Ext Indicis Nml Nml Nml Nml 1- Rapid Some 1+ Some 1+ Nml Nml N/A  Right FlexPolLong Nml Nml Nml Nml 1- Mod-R Few 1+ Nml Nml Nml Nml N/A  Right Triceps Nml Nml Nml Nml Nml Nml Nml Nml Nml Nml Nml Nml N/A  Right Abd Poll Brev Nml Nml Nml Nml 1- Mod-R Few 1+ Nml Nml Nml Nml N/A      Waveforms:

## 2014-07-08 ENCOUNTER — Encounter: Payer: Self-pay | Admitting: Neurology

## 2014-07-08 ENCOUNTER — Ambulatory Visit (INDEPENDENT_AMBULATORY_CARE_PROVIDER_SITE_OTHER): Payer: Medicare Other | Admitting: Neurology

## 2014-07-08 VITALS — BP 130/74 | HR 84 | Ht 64.96 in | Wt 141.4 lb

## 2014-07-08 DIAGNOSIS — G232 Striatonigral degeneration: Secondary | ICD-10-CM

## 2014-07-08 DIAGNOSIS — G14 Postpolio syndrome: Secondary | ICD-10-CM

## 2014-07-08 DIAGNOSIS — B91 Sequelae of poliomyelitis: Secondary | ICD-10-CM

## 2014-07-08 DIAGNOSIS — G238 Other specified degenerative diseases of basal ganglia: Secondary | ICD-10-CM

## 2014-07-08 DIAGNOSIS — G253 Myoclonus: Secondary | ICD-10-CM

## 2014-07-08 DIAGNOSIS — G609 Hereditary and idiopathic neuropathy, unspecified: Secondary | ICD-10-CM

## 2014-07-08 NOTE — Progress Notes (Signed)
Follow-up Visit   Date: 07/08/2014    Richard Blair MRN: [id number] DOB: 07/11/1926   Interim History: Richard Blair is a 80 y.o. right-handed Caucasian male with history of history of poliomyelitis with residual left leg weakness, pre-diabetes, hypertension, hyperlipidemia (off statins due to intolerance), CAD s/p CABG x 3 (2006), basal cell carcinoma scalp s/p resection, and colon cancer s/p resection (1984) returning to the clinic for follow-up of parksinsonism, gait ataxia, and leg weakness.  The patient was accompanied to the clinic by daughter who also provides collateral information.    History of present illness: He reports having polio involving the left leg when very he was very young child. He does not recall what age, but says that he wore a cast intermittently and underwent ankle surgery at the age of 7 for ?foot inversion. He was eventually able to stop wearing a brace, but always had atrophy and weakness of the left leg. Despite his weakness, he he played football in high school.   He was doing well until 2011 when he developed left foot drop and needed a left leg brace. In 2013, he started using a cane due to problems with balance, gait, and increased falls. He did physical therapy in 2013 which helped, again in fall 2014 and early 2015, but due to no improvement, he was discharged. He was active until last year where he was mowing the lawn with a push mower, but since then has become more sedentary due to gait instability. He is currently ambulating with a rollator and feels stable when using it but is very unsteady if he does not have support.  He has fallen once this year, but when symptoms were severe fell 4 times in 6-days. Most of the time when he falls, he falls backwards. He cannot stand up from falls and would have to call for help or go to furniture. No preceding lightheadedness, changes in vision, palpitations, or warning symptoms.   Upon further questioning,  he has slowed movements, speech is softer and slightly slurred, intermittent chocking spells with solids and liquids. There is no changes in handwriting, tremor, constipation, changes in small/taste, hallucinations, vivid creams, or acting out of his dreams.   He has been followed by Dr. Melrose Nakayama and was given a trial of sinemet 25/100 three times per day for one week, but due to no change in symptoms it was stopped.   - Follow-up 05/22/2014:  At his last visit sinemet 25/100 was started and he recently increased it to 2 tablets twice daily. Richard Blair does not notice any significant difference, but his daughter and wife say that it has helped.  For instance, he fell forwards trying to pick something up off the floor and family say that he did not require as much help to get up as before.  Since March, he has only fallen twice which is improved.    UPDATE 07/08/2014:  He underwent EMG of the left arm and leg which showed moderately severe length-dependent sensorimotor polyneuropathy as well as changes consistent with poliomyelitis involving the left leg.  He does not feel any significant improvement, since being on sinemet, but his wife feels that he is stronger.  Last week, he was able to walk about 12 feet without the walker, which he had not done in the past 26-months. He also mowed his lawn on a riding mower after his son helped him onto it, but felt quite tired by the end of it.  He  has lost about 3lb since April 2015 and attributes this to poor appetite.  He denies any problems with swallowing.     Medications:  Current Outpatient Prescriptions on File Prior to Visit  Medication Sig Dispense Refill  . aspirin 81 MG tablet Take 81 mg by mouth daily.      . carbidopa-levodopa (SINEMET IR) 25-100 MG per tablet Take 2 tablets by mouth 3 (three) times daily.  200 tablet  5  . Cholecalciferol (VITAMIN D-3) 1000 UNITS CAPS Take by mouth daily.      . fluticasone (FLONASE) 50 MCG/ACT nasal spray Place 2 sprays  into both nostrils daily.      Marland Kitchen lisinopril (PRINIVIL,ZESTRIL) 10 MG tablet Take 10 mg by mouth daily.      . vitamin B-12 (CYANOCOBALAMIN) 1000 MCG tablet Take 1,000 mcg by mouth daily.       No current facility-administered medications on file prior to visit.    Allergies:  Allergies  Allergen Reactions  . Aleve [Naproxen Sodium]   . Crestor [Rosuvastatin]   . Lescol [Fluvastatin Sodium]   . Lipitor [Atorvastatin]      Review of Systems:  CONSTITUTIONAL: No fevers, chills, night sweats, or weight loss.   EYES: No visual changes or eye pain ENT: No hearing changes.  No history of nose bleeds.   RESPIRATORY: No cough, wheezing and shortness of breath.   CARDIOVASCULAR: Negative for chest pain, and palpitations.   GI: Negative for abdominal discomfort, blood in stools or black stools.  No recent change in bowel habits.   GU:  No history of incontinence.   MUSCLOSKELETAL: No history of joint pain or swelling.  No myalgias.   SKIN: Negative for lesions, rash, and itching.   ENDOCRINE: Negative for cold or heat intolerance, polydipsia or goiter.   PSYCH:  No depression or anxiety symptoms.   NEURO: As Above.   Vital Signs:  BP 130/74  Pulse 84  Ht 5' 4.96" (1.65 m)  Wt 141 lb 6 oz (64.127 kg)  BMI 23.55 kg/m2  SpO2 95%  Neurological Exam: MENTAL STATUS including orientation to time, place, person, recent and remote memory, attention span and concentration, language, and fund of knowledge is normal.  Speech is soft and slightly hypophonic.  CRANIAL NERVES:   Pupils equal round and reactive to light. No square wave jerks. Vertical gaze paresis bilaterally especially with upward gaze.  Horizontal gaze is intact. Pursuit is slightly jerky and there is evidence of overshoot with saccadic eye movements. There is no ptosis. Face is symmetric. Palate elevates symmetrically. Palatal myoclonus present.  Tongue is midline.  Tongue movements are moderately slowed and there is slight  weakness.  No tongue fasciculations. Left palmomental is present and Myerson sign is positive. There is no snout.   MOTOR:  Motor strength is 5/5 upper extremities, see below table for lower extremities (hip flexion is improved).  Prominent atrophy of the left lower extremity especially below the knee. Tone is normal (improved).  Right Lower Extremity:    Left Lower Extremity:    Hip flexors  5/5   Hip flexors  5/5   Hip extensors  5/5   Hip extensors  5-/5   Abductor  5/5   Abductor  5/5   Adductor  5/5   Adductor  5/5   Knee flexors  5/5   Knee flexors  4+/5   Knee extensors  5/5   Knee extensors  5-/5   Dorsiflexors  5/5   Dorsiflexors  1/5  Plantarflexors  5/5   Plantarflexors  3/5   Toe extensors  5/5   Toe extensors  0/5   Toe flexors  5/5   Toe flexors  1/5   Tone (Ashworth scale)  0   Tone (Ashworth scale)  0     MSRs:  Right      Left  brachioradialis  3+   brachioradialis  3+   Biceps  2+   biceps  2+   triceps  2+   triceps  2+   patellar  1+   patellar  0   ankle jerk  trace   ankle jerk  0   Hoffman  no   Hoffman  no   plantar response  down   plantar response  down    SENSORY: Pin prick and temperature is reduced distal to the knees bilaterally. Vibration is absent at the knees and distally but intact at the MCP. Proprioception is impaired at the great toe bilaterally.   COORDINATION/GAIT: Minimal dysmetria with finger to nose testing (improved). Finger tapping and heel tapping shows slowed movements with decrement. He is unable to rise from a chair without using arms and it takes several attempts. Gait is moderately ataxic and assisted with rollator.  There is no trunal ataxia and he is able to get up by himself (previously required two-person assist).  He veers to the right when walking, small steps, no shuffling, walks faster than would be expected for the degree of instability.   Data: Labs 09/04/2013: B12 407  Labs 01/02/2014: HbA1c 6.3, Cr 0.9, Na 137, AST 14,  ALT 12  Chol 143  TH 75  LDL 90.9, HDL 37 Labs 01/15/2014: CK 76, ESR 38, MG panel - neg Labs 04/02/2014:  Heavy metal screen neg, Mg 2.0, vitamin B6 7.7, copper 91, ceruloplasmin 25, RPR neg, CRP 1.8, folate 15.4, vitamin E 2.2, SPEP/UPEP with IFE no M protein, TSH 4.127  US carotids 04/07/2014: No hemodynamically significant stenosis   MRI/A brain wwo contrast 04/11/2014:  Atrophy and small vessel disease. No acute intracranial findings. No abnormal postcontrast enhancement. Potentially flow reducing 75% supraclinoid left ICA stenosis. No evidence for vertebrobasilar insufficiency.   EMG 06/05/2014: 1. There is electrophysiologic evidence of a generalized sensorimotor polyneuropathy, axon loss in type, affecting the right upper and lower extremity, conforming to a gradient pattern. Overall, these findings are moderately severe in degree electrically. 2. Severe chronic motor axon loss changes are seen affecting the left lower extremity suggestive of an intraspinal canal lesion (anterior horn cell, radiculopathy, etc) affecting the L3-S1 nerve roots/segments. These findings are most likely reflect sequela of known poliomyelitis. 3. A superimposed multilevel intraspinal canal lesion process affecting the right L3-L5 nerve roots/segments cannot be excluded.    IMPRESSION/PLAN: Richard Blair is 80 year old gentleman with history of polio affecting the left lower leg returns for follow-up of gait abnormality, weakness, and falls. His examination appears relatively stable from his last office visit. He continues to have palatal myotonus, soft voice, left leg weakness, peripheral sensory loss, and gait ataxia.  He underwent extensive work-up including MRI/A brain as well as serological testing which has largely been non-diagnostic. He has LICA supraclinoid stenosis and I recommended that he continue aspirin $RemoveBeforeDE'81mg'aPVJBpjYrYzSEng$ .  His LDL is 90 and has history of statin intolerance, so is current not on medications.  Although he  denies significant improvement, his clinical exam showed marked improvement after starting sinemet especially with truncal ataxia and tone, however gait ataxia remains problematic and prominent.  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    His EMG shows moderately severe length-dependent peripheral neuropathy, likely idiopathic, which is undoubtedly also contributing to his gait ataxia.  Unfortunately, extensive work-up looking for treatable causes of neuropathy has been nondiagnostic.  I explained that management is symptomatic.  There is no strong electrodiagnostic evidence to suggest additional testing (CSF analysis) to look for polyradiculoneuropathy, but if symptoms progress in a pattern that is atypical for parkinson's or neuropathy, it may be considered.   Since he has shown some improvement with start sinemet, I want him start PT for gait training and LSVT therapy. He will continue sinemet 25/100 2 tablets three times daily.  Continue to monitor weight closely, may consider MBS going forward.  I will see him back in 61-month, or sooner as needed   The duration of this appointment visit was 40 minutes of face-to-face time with the patient.  Greater than 50% of this time was spent in counseling, explanation of diagnosis, planning of further management, and coordination of care.   Thank you for allowing me to participate in patient's care.  If I can answer any additional questions, I would be pleased to do so.    Sincerely,    Yarexi Pawlicki K. Posey Pronto, DO

## 2014-07-08 NOTE — Patient Instructions (Signed)
1.  Start physical therapy for gait training 2.  Continue sinemet 25/100 2 tablets three times daily 3.  If swallowing becomes problematic, please let my office know 4.  Return to clinic 24-months  [place] that can locate patients outside the home:  http://mobilealertsystems.com/ 5180938659 http://www.lifelinesys.com/content/lifeline-products/get-life-gosafe  5414948848 www.verizonwireless.com/sure/ (602)361-1931  Medial Alert systems that link to smart phones:  http://www.lifelinesys.com/content/lifeline-products/response-app https://www.stanton.info/ RecycleRoad.pl.aspx  [employer] GPS Tracker:  VoiceZap.is 602-089-9312

## 2014-07-24 ENCOUNTER — Encounter: Payer: Self-pay | Admitting: Neurology

## 2014-08-01 ENCOUNTER — Other Ambulatory Visit: Payer: Self-pay | Admitting: *Deleted

## 2014-08-01 DIAGNOSIS — R2681 Unsteadiness on feet: Secondary | ICD-10-CM

## 2014-08-01 DIAGNOSIS — G20 Parkinson's disease: Secondary | ICD-10-CM

## 2014-08-19 ENCOUNTER — Encounter: Payer: Self-pay | Admitting: Neurology

## 2014-09-12 ENCOUNTER — Telehealth: Payer: Self-pay | Admitting: Neurology

## 2014-09-12 ENCOUNTER — Ambulatory Visit (INDEPENDENT_AMBULATORY_CARE_PROVIDER_SITE_OTHER): Payer: Medicare Other | Admitting: Neurology

## 2014-09-12 ENCOUNTER — Encounter: Payer: Self-pay | Admitting: Neurology

## 2014-09-12 VITALS — BP 120/68 | HR 85 | Ht 64.0 in | Wt 139.5 lb

## 2014-09-12 DIAGNOSIS — G238 Other specified degenerative diseases of basal ganglia: Secondary | ICD-10-CM

## 2014-09-12 DIAGNOSIS — G20C Parkinsonism, unspecified: Secondary | ICD-10-CM | POA: Insufficient documentation

## 2014-09-12 DIAGNOSIS — R498 Other voice and resonance disorders: Secondary | ICD-10-CM

## 2014-09-12 DIAGNOSIS — R269 Unspecified abnormalities of gait and mobility: Secondary | ICD-10-CM

## 2014-09-12 DIAGNOSIS — G232 Striatonigral degeneration: Secondary | ICD-10-CM | POA: Insufficient documentation

## 2014-09-12 DIAGNOSIS — R260 Ataxic gait: Secondary | ICD-10-CM

## 2014-09-12 DIAGNOSIS — Z9181 History of falling: Secondary | ICD-10-CM

## 2014-09-12 NOTE — Progress Notes (Signed)
Note faxed to PCP.

## 2014-09-12 NOTE — Progress Notes (Signed)
Patient's wife called back and said that patient would like to wait to do this.  He will discuss at his next appointment.

## 2014-09-12 NOTE — Progress Notes (Signed)
Follow-up Visit   Date: 09/12/2014    Richard Blair MRN: [id number] DOB: 1926/03/23   Interim History: Malakye Nolden is a 80 y.o. right-handed Caucasian male with history of history of poliomyelitis with residual left leg weakness, pre-diabetes, hypertension, hyperlipidemia (off statins due to intolerance), CAD s/p CABG x 3 (2006), basal cell carcinoma scalp s/p resection, and colon cancer s/p resection (1984) returning to the clinic for follow-up of parksinsonism, gait ataxia, and leg weakness.  The patient was accompanied to the clinic by daughter who also provides collateral information.    History of present illness: He reports having polio involving the left leg when very he was very young child. He does not recall what age, but says that he wore a cast intermittently and underwent ankle surgery at the age of 80 for ?foot inversion. He was eventually able to stop wearing a brace, but always had atrophy and weakness of the left leg. Despite his weakness, he he played football in high school.   He was doing well until 2011 when he developed left foot drop and needed a left leg brace. In 2013, he started using a cane due to problems with balance, gait, and increased falls. He did physical therapy in 2013 which helped, again in fall 2014 and early 2015, but due to no improvement, he was discharged. He was active until last year where he was mowing the lawn with a push mower, but since then has become more sedentary due to gait instability. He is currently ambulating with a rollator and feels stable when using it but is very unsteady if he does not have support.  He has fallen once this year, but when symptoms were severe fell 4 times in 6-days. Most of the time when he falls, he falls backwards. He cannot stand up from falls and would have to call for help or go to furniture. No preceding lightheadedness, changes in vision, palpitations, or warning symptoms.   Upon further questioning,  he has slowed movements, speech is softer and slightly slurred, intermittent chocking spells with solids and liquids. There is no changes in handwriting, tremor, constipation, changes in small/taste, hallucinations, vivid creams, or acting out of his dreams.   He has been followed by Dr. Melrose Nakayama and was given a trial of sinemet 25/100 three times per day for one week, but due to no change in symptoms it was stopped.   - Follow-up 05/22/2014:  At his last visit sinemet 25/100 was started and he recently increased it to 2 tablets twice daily. Mr. Hyland does not notice any significant difference, but his daughter and wife say that it has helped.  For instance, he fell forwards trying to pick something up off the floor and family say that he did not require as much help to get up as before.  Since March, he has only fallen twice which is improved.    - Follow-up 07/08/2014:  He underwent EMG of the left arm and leg which showed moderately severe length-dependent sensorimotor polyneuropathy as well as changes consistent with poliomyelitis involving the left leg.  He does not feel any significant improvement, since being on sinemet, but his wife feels that he is stronger.  Last week, he was able to walk about 12 feet without the walker, which he had not done in the past 64-months. He also mowed his lawn on a riding mower after his son helped him onto it, but felt quite tired by the end of it.  He has lost about 3lb since April 2015 and attributes this to poor appetite.      UPDATE 09/12/2014:  He has completed LSVT and reports improved strength.  He is able to support himself better and is able to stand for 2-min unassisted and longer with support.  He was also recently found to have melanoma of the back and basal cell carcinoma which was resected.  He has fallen once last week, but was able to use the walker to get up by himself.  He denies any problems with swallowing.   Medications:  Current Outpatient  Prescriptions on File Prior to Visit  Medication Sig Dispense Refill  . aspirin 81 MG tablet Take 81 mg by mouth daily.      . carbidopa-levodopa (SINEMET IR) 25-100 MG per tablet Take 2 tablets by mouth 3 (three) times daily.  200 tablet  5  . Cholecalciferol (VITAMIN D-3) 1000 UNITS CAPS Take by mouth daily.      . fluticasone (FLONASE) 50 MCG/ACT nasal spray Place 2 sprays into both nostrils daily.      Marland Kitchen lisinopril (PRINIVIL,ZESTRIL) 10 MG tablet Take 10 mg by mouth daily.      . vitamin B-12 (CYANOCOBALAMIN) 1000 MCG tablet Take 1,000 mcg by mouth daily.       No current facility-administered medications on file prior to visit.    Allergies:  Allergies  Allergen Reactions  . Aleve [Naproxen Sodium]   . Crestor [Rosuvastatin]   . Lescol [Fluvastatin Sodium]   . Lipitor [Atorvastatin]      Review of Systems:  CONSTITUTIONAL: No fevers, chills, night sweats, or weight loss.   EYES: No visual changes or eye pain ENT: No hearing changes.  No history of nose bleeds.   RESPIRATORY: No cough, wheezing and shortness of breath.   CARDIOVASCULAR: Negative for chest pain, and palpitations.   GI: Negative for abdominal discomfort, blood in stools or black stools.  No recent change in bowel habits.   GU:  No history of incontinence.   MUSCLOSKELETAL: No history of joint pain or swelling.  No myalgias.   SKIN: Negative for lesions, rash, and itching.   ENDOCRINE: Negative for cold or heat intolerance, polydipsia or goiter.   PSYCH:  No depression or anxiety symptoms.   NEURO: As Above.   Vital Signs:  There were no vitals taken for this visit.  Neurological Exam: MENTAL STATUS including orientation to time, place, person, recent and remote memory, attention span and concentration, language, and fund of knowledge is normal.  Speech is soft and slightly hypophonic.  CRANIAL NERVES:   Pupils equal round and reactive to light. No square wave jerks. Vertical gaze paresis bilaterally  especially with upward gaze.  Horizontal gaze is intact. Pursuit is slightly jerky and there is evidence of overshoot with saccadic eye movements. There is no ptosis. Face is symmetric. Palate elevates symmetrically. Palatal myoclonus present.  Tongue is midline.  Tongue movements are moderately slowed and there is slight weakness.  Left palmomental is present and Myerson sign is positive. There is no snout.   MOTOR:  Motor strength is 5/5 upper extremities, see below table for lower extremities (hip flexion is improved).  Prominent atrophy of the left lower extremity especially below the knee. Tone is normal (improved).  Right Lower Extremity:    Left Lower Extremity:    Hip flexors  5/5   Hip flexors  5/5   Hip extensors  5/5   Hip extensors  5-/5  Abductor  5/5   Abductor  5/5   Adductor  5/5   Adductor  5/5   Knee flexors  5/5   Knee flexors  4+/5   Knee extensors  5/5   Knee extensors  5-/5   Dorsiflexors  5/5   Dorsiflexors  1/5   Plantarflexors  5/5   Plantarflexors  3/5   Toe extensors  5/5   Toe extensors  0/5   Toe flexors  5/5   Toe flexors  1/5   Tone (Ashworth scale)  0   Tone (Ashworth scale)  0     MSRs:  Right      Left  brachioradialis  3+   brachioradialis  3+   Biceps  2+   biceps  2+   triceps  2+   triceps  2+   patellar  1+   patellar  0   ankle jerk  trace   ankle jerk  0   Hoffman  no   Hoffman  no   plantar response  down   plantar response  down    SENSORY: Pin prick and temperature is reduced distal to the knees bilaterally. Vibration is absent at the knees and distally but intact at the MCP. Proprioception is impaired at the great toe bilaterally.   COORDINATION/GAIT: Minimal dysmetria with finger to nose testing (improved). Finger tapping and heel tapping shows slowed movements with decrement. He is unable to rise from a chair without using arms and it takes several attempts. Gait is moderately ataxic and assisted with rollator.  There is no trunal ataxia  and he is able to get up by himself (previously required two-person assist).  He veers to the right when walking, small steps, no shuffling, walks faster than would be expected for the degree of instability.   Data: Labs 09/04/2013: B12 407  Labs 01/02/2014: HbA1c 6.3, Cr 0.9, Na 137, AST 14, ALT 12  Chol 143  TH 75  LDL 90.9, HDL 37 Labs 01/15/2014: CK 76, ESR 38, MG panel - neg Labs 04/02/2014:  Heavy metal screen neg, Mg 2.0, vitamin B6 7.7, copper 91, ceruloplasmin 25, RPR neg, CRP 1.8, folate 15.4, vitamin E 2.2, SPEP/UPEP with IFE no M protein, TSH 4.127  US carotids 04/07/2014: No hemodynamically significant stenosis   MRI/A brain wwo contrast 04/11/2014:  Atrophy and small vessel disease. No acute intracranial findings. No abnormal postcontrast enhancement. Potentially flow reducing 75% supraclinoid left ICA stenosis. No evidence for vertebrobasilar insufficiency.   EMG 06/05/2014: 1. There is electrophysiologic evidence of a generalized sensorimotor polyneuropathy, axon loss in type, affecting the right upper and lower extremity, conforming to a gradient pattern. Overall, these findings are moderately severe in degree electrically. 2. Severe chronic motor axon loss changes are seen affecting the left lower extremity suggestive of an intraspinal canal lesion (anterior horn cell, radiculopathy, etc) affecting the L3-S1 nerve roots/segments. These findings are most likely reflect sequela of known poliomyelitis. 3. A superimposed multilevel intraspinal canal lesion process affecting the right L3-L5 nerve roots/segments cannot be excluded.    IMPRESSION/PLAN: 1.  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  Dopa-responsiveness makes PSP possible also.  - Clinically improved after completed LSVT and started levadopa  - Symptoms:  Progressive gait instability, dysarthria, ataxia  - Exam:  palatal myotonus, hypophonia, dysarthria, peripheral neuropathy, and gait  ataxia.     Truncal ataxia improved after starting levadopa  - Continue sinemet 25/100 2 tablets three times daily  - Continue home  PT exercises, strongly encouraged to use walker  2. Weight loss of 5lb (unintentional)  - Encouraged to add 1-2 cans of ensure to diet  - Denies any dysphagia or choking spells  - MBS for baseline  3. Intracranial stenosis (LICA supraclinoid stenosis)  - Clinically stable  - Continue ASA $RemoveBefo'81mg'[id number]$  daily  - His LDL is 90 and has history of statin intolerance, so is current not on medications.    4.  Idiopathic peripheral neuropathy manifesting with gait ataxia and numbness, clinically stable  - EMG shows moderately severe length-dependent peripheral neuropathy  - Neuropathy labs are normal  5.  Advanced Directives discussed as patient does not have this documented, he has made his wishes known to family and PCP that he does not want any aggressive life-prolonging measures.  6.  Return to clinic in 80-months    The duration of this appointment visit was 40 minutes of face-to-face time with the patient.  Greater than 50% of this time was spent in counseling, explanation of diagnosis, planning of further management, and coordination of care.   Thank you for allowing me to participate in patient's care.  If I can answer any additional questions, I would be pleased to do so.    Sincerely,    Donika K. Posey Pronto, DO

## 2014-09-12 NOTE — Patient Instructions (Addendum)
You most likely have multiple system atrophy, cerebellar type.  1.  Continue levadopa 25/100mg  2 tablets three times daily 2.  Continue your home exercises twice daily 3.  Start adding 1-2 cans of ensure to your diet  4.  Continue to use the walker 5.  Information provided on Advance Directive 6.  Monitor your weight 7.  Return to clinic in 69-months

## 2014-09-12 NOTE — Progress Notes (Signed)
I spoke with Mrs. Weitzel and she is going to check with patient to see if he wants to do the test.  She will call me back with an answer later.

## 2014-09-12 NOTE — Telephone Encounter (Signed)
See next note

## 2014-09-12 NOTE — Telephone Encounter (Signed)
Audrey-Pt's spouse called/returning your call at 3:46PM.  C/B [phone]

## 2014-12-01 ENCOUNTER — Other Ambulatory Visit: Payer: Self-pay | Admitting: Neurology

## 2014-12-01 NOTE — Telephone Encounter (Signed)
Rx sent 

## 2015-01-16 ENCOUNTER — Ambulatory Visit (INDEPENDENT_AMBULATORY_CARE_PROVIDER_SITE_OTHER): Payer: Medicare Other | Admitting: Neurology

## 2015-01-16 ENCOUNTER — Encounter: Payer: Self-pay | Admitting: Neurology

## 2015-01-16 VITALS — BP 130/80 | HR 90 | Ht 65.0 in | Wt 142.1 lb

## 2015-01-16 DIAGNOSIS — G238 Other specified degenerative diseases of basal ganglia: Secondary | ICD-10-CM

## 2015-01-16 DIAGNOSIS — G253 Myoclonus: Secondary | ICD-10-CM

## 2015-01-16 DIAGNOSIS — R278 Other lack of coordination: Secondary | ICD-10-CM

## 2015-01-16 DIAGNOSIS — G14 Postpolio syndrome: Secondary | ICD-10-CM

## 2015-01-16 NOTE — Progress Notes (Signed)
Follow-up Visit   Date: 01/16/2015    Richard Blair MRN: [id number] DOB: 21-Jul-1926   Interim History: Richard Blair is a 81 y.o. right-handed Caucasian male with history of history of poliomyelitis with residual left leg weakness, pre-diabetes, hypertension, hyperlipidemia (off statins due to intolerance), CAD s/p CABG x 3 (2006), basal cell carcinoma scalp s/p resection, and colon cancer s/p resection (1984) returning to the clinic for follow-up of multiple systems atrophy.  The patient was accompanied to the clinic by daughter and wife who also provides collateral information.    History of present illness: He reports having polio involving the left leg when very he was very young child. He does not recall what age, but says that he wore a cast intermittently and underwent ankle surgery at the age of 60 for ?foot inversion. He was eventually able to stop wearing a brace, but always had atrophy and weakness of the left leg. Despite his weakness, he he played football in high school.   He was doing well until 2011 when he developed left foot drop and needed a left leg brace. In 2013, he started using a cane due to problems with balance, gait, and increased falls. He did physical therapy in 2013 which helped, again in fall 2014 and early 2015, but due to no improvement, he was discharged. He was active until last year where he was mowing the lawn with a push mower, but since then has become more sedentary due to gait instability. He is currently ambulating with a rollator and feels stable when using it but is very unsteady if he does not have support.  He has fallen once this year, but when symptoms were severe fell 4 times in 6-days. Most of the time when he falls, he falls backwards. He cannot stand up from falls and would have to call for help or go to furniture. No preceding lightheadedness, changes in vision, palpitations, or warning symptoms.   Upon further questioning, he has  slowed movements, speech is softer and slightly slurred, intermittent chocking spells with solids and liquids. There is no changes in handwriting, tremor, constipation, changes in small/taste, hallucinations, vivid creams, or acting out of his dreams.   He has been followed by Richard Blair and was given a trial of sinemet 25/100 three times per day for one week, but due to no change in symptoms it was stopped.   - Follow-up 05/22/2014:  At his last visit sinemet 25/100 was started and he recently increased it to 2 tablets twice daily. Richard Blair does not notice any significant difference, but his daughter and wife say that it has helped.  For instance, he fell forwards trying to pick something up off the floor and family say that he did not require as much help to get up as before.  Since March, he has only fallen twice which is improved.    - Follow-up 07/08/2014:  He underwent EMG of the left arm and leg which showed moderately severe length-dependent sensorimotor polyneuropathy as well as changes consistent with poliomyelitis involving the left leg.  He does not feel any significant improvement, since being on sinemet, but his wife feels that he is stronger.  He was able to walk about 12 feet without the walker, which he had not done in the past 62-month. He also mowed his lawn on a riding mower after his son helped him onto it, but felt quite tired by the end of it.  He has lost  about 3lb since April 2015 and attributes this to poor appetite.      - Follow-up 09/12/2014:  He has completed LSVT and reports improved strength.  He is able to support himself better and is able to stand for 2-min unassisted and longer with support.  He was also recently found to have melanoma of the back and basal cell carcinoma which was resected.  He has fallen once last week, but was able to use the walker to get up by himself.   UPDATE 01/16/2015: Up until a few weeks ago, he was standing up to 7-min, but has not been feeling  well the past few days.  He has gained 3lb since adding ensure to his diet.  He slipped off the side of the bed and was able to get up himself, which is an improvement because previously he would require help.  No new complaints, but he continues to be somewhat discouraged that his balance is still so impaired.   Medications:  Current Outpatient Prescriptions on File Prior to Visit  Medication Sig Dispense Refill  . aspirin 81 MG tablet Take 81 mg by mouth daily.    . carbidopa-levodopa (SINEMET IR) 25-100 MG per tablet TAKE TWO TABLETS BY MOUTH THREE TIMES DAILY 180 tablet 3  . Cholecalciferol (VITAMIN D-3) 1000 UNITS CAPS Take by mouth daily.    . fluticasone (FLONASE) 50 MCG/ACT nasal spray Place 2 sprays into both nostrils daily.    Marland Kitchen lisinopril (PRINIVIL,ZESTRIL) 10 MG tablet Take 10 mg by mouth daily.    . vitamin B-12 (CYANOCOBALAMIN) 1000 MCG tablet Take 1,000 mcg by mouth daily.     No current facility-administered medications on file prior to visit.    Allergies:  Allergies  Allergen Reactions  . Aleve [Naproxen Sodium]   . Crestor [Rosuvastatin]   . Lescol [Fluvastatin Sodium]   . Lipitor [Atorvastatin]      Review of Systems:  CONSTITUTIONAL: No fevers, chills, night sweats, or weight loss.   EYES: No visual changes or eye pain ENT: No hearing changes.  No history of nose bleeds.   RESPIRATORY: No cough, wheezing and shortness of breath.   CARDIOVASCULAR: Negative for chest pain, and palpitations.   GI: Negative for abdominal discomfort, blood in stools or black stools.  No recent change in bowel habits.   GU:  No history of incontinence.   MUSCLOSKELETAL: No history of joint pain or swelling.  No myalgias.   SKIN: Negative for lesions, rash, and itching.   ENDOCRINE: Negative for cold or heat intolerance, polydipsia or goiter.   PSYCH:  No depression or anxiety symptoms.   NEURO: As Above.   Vital Signs:  BP 130/80 mmHg  Pulse 90  Ht _0  (1.651 m)  Wt 142  lb 2 oz (64.467 kg)  BMI 23.65 kg/m2  SpO2 98%  Neurological Exam: MENTAL STATUS including orientation to time, place, person, recent and remote memory, attention span and concentration, language, and fund of knowledge is normal.  Speech is soft and slightly hypophonic.  CRANIAL NERVES:   Pupils equal round and reactive to light. No square wave jerks. Vertical gaze paresis bilaterally especially with upward gaze.  Horizontal gaze is intact. Pursuit is slightly jerky and there is evidence of overshoot with saccadic eye movements.  Face is symmetric. Palate elevates symmetrically. Palatal myoclonus present.  Tongue is midline.  Tongue movements are moderately slowed and there is slight weakness.  Left palmomental is present and Myerson sign is positive. There is  no snout.   MOTOR:  Motor strength is 5/5 upper extremities, see below table for lower extremities (hip flexion is improved).  Prominent atrophy of the left lower extremity especially below the knee. Tone is slightly increased in the RUE (0+).  Right Lower Extremity:    Left Lower Extremity:    Hip flexors  5/5   Hip flexors  5/5   Hip extensors  5/5   Hip extensors  5-/5   Abductor  5/5   Abductor  5/5   Adductor  5/5   Adductor  5/5   Knee flexors  5/5   Knee flexors  4+/5   Knee extensors  5/5   Knee extensors  5-/5   Dorsiflexors  5/5   Dorsiflexors  1/5   Plantarflexors  5/5   Plantarflexors  3/5   Toe extensors  5/5   Toe extensors  0/5   Toe flexors  5/5   Toe flexors  1/5   Tone (Ashworth scale)  0   Tone (Ashworth scale)  0     MSRs:  Right      Left  brachioradialis  3+   brachioradialis  3+   Biceps  2+   biceps  2+   triceps  2+   triceps  2+   patellar  1+   patellar  0   ankle jerk  trace   ankle jerk  0   Hoffman  no   Hoffman  no   plantar response  down   plantar response  down    COORDINATION/GAIT: Minimal dysmetria with finger to nose testing (improved). Finger tapping and heel tapping shows slowed  movements with decrement. He is unable to rise from a chair without using arms and it takes several attempts. Gait is moderately ataxic and assisted with rollator. He walks with small steps, no shuffling, and faster than would be expected for the degree of instability.   Data: Labs 09/04/2013: B12 407  Labs 01/02/2014: HbA1c 6.3, Cr 0.9, Na 137, AST 14, ALT 12  Chol 143  TH 75  LDL 90.9, HDL 37 Labs 01/15/2014: CK 76, ESR 38, MG panel - neg Labs 04/02/2014:  Heavy metal screen neg, Mg 2.0, vitamin B6 7.7, copper 91, ceruloplasmin 25, RPR neg, CRP 1.8, folate 15.4, vitamin E 2.2, SPEP/UPEP with IFE no M protein, TSH 4.127  US carotids 04/07/2014: No hemodynamically significant stenosis   MRI/A brain wwo contrast 04/11/2014:  Atrophy and small vessel disease. No acute intracranial findings. No abnormal postcontrast enhancement. Potentially flow reducing 75% supraclinoid left ICA stenosis. No evidence for vertebrobasilar insufficiency.   EMG 06/05/2014: 1. There is electrophysiologic evidence of a generalized sensorimotor polyneuropathy, axon loss in type, affecting the right upper and lower extremity, conforming to a gradient pattern. Overall, these findings are moderately severe in degree electrically. 2. Severe chronic motor axon loss changes are seen affecting the left lower extremity suggestive of an intraspinal canal lesion (anterior horn cell, radiculopathy, etc) affecting the L3-S1 nerve roots/segments. These findings are most likely reflect sequela of known poliomyelitis. 3. A superimposed multilevel intraspinal canal lesion process affecting the right L3-L5 nerve roots/segments cannot be excluded.    IMPRESSION/PLAN: 1.  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  Dopa-responsiveness makes PSP possible also.  - Clinically stable.  Lengthy discussion on disease, management options, and prognosis  - Symptoms:  Progressive gait instability, dysarthria,  ataxia  - Exam:  palatal myotonus, hypophonia, dysarthria, peripheral neuropathy, and gait ataxia.  Truncal ataxia improved after starting levadopa  - Continue sinemet 25/100 2 tablets three times daily  - Continue home PT exercises  - Strongly encouraged to use walker  2. Weight loss - improved since adding ensure to diet  - Denies any dysphagia or choking spells  3. Intracranial stenosis (LICA supraclinoid stenosis)  - Clinically stable  - Continue ASA 87m daily  - His LDL is 90 and has history of statin intolerance, so is current not on medications.    4.  Idiopathic peripheral neuropathy manifesting with gait ataxia and numbness, clinically stable  - EMG shows moderately severe length-dependent peripheral neuropathy  5.  Advanced Directives discussed, he has made his wishes known to family and PCP that he does not want any aggressive life-prolonging measures.  6.  Return to clinic in 637-month   The duration of this appointment visit was 30 minutes of face-to-face time with the patient.  Greater than 50% of this time was spent in counseling, explanation of diagnosis, planning of further management, and coordination of care.   Thank you for allowing me to participate in patient's care.  If I can answer any additional questions, I would be pleased to do so.    Sincerely,    Donika K. PaPosey ProntoDO

## 2015-01-16 NOTE — Progress Notes (Signed)
Note faxed to PCP.

## 2015-01-16 NOTE — Patient Instructions (Signed)
1.  Continue your medications as you are taking them 2.  Keep up with your home exercises 3.  Return to clinic in 6 -months or sooner as needed

## 2015-04-01 ENCOUNTER — Other Ambulatory Visit: Payer: Self-pay | Admitting: Neurology

## 2015-04-01 ENCOUNTER — Other Ambulatory Visit: Payer: Self-pay | Admitting: *Deleted

## 2015-04-01 NOTE — Telephone Encounter (Signed)
Rx sent 

## 2015-07-20 ENCOUNTER — Ambulatory Visit (INDEPENDENT_AMBULATORY_CARE_PROVIDER_SITE_OTHER): Payer: Medicare Other | Admitting: Neurology

## 2015-07-20 ENCOUNTER — Encounter: Payer: Self-pay | Admitting: Neurology

## 2015-07-20 VITALS — BP 158/80 | HR 90 | Ht 65.0 in | Wt 144.0 lb

## 2015-07-20 DIAGNOSIS — G238 Other specified degenerative diseases of basal ganglia: Secondary | ICD-10-CM | POA: Diagnosis not present

## 2015-07-20 DIAGNOSIS — Z9181 History of falling: Secondary | ICD-10-CM

## 2015-07-20 DIAGNOSIS — G903 Multi-system degeneration of the autonomic nervous system: Secondary | ICD-10-CM

## 2015-07-20 DIAGNOSIS — G14 Postpolio syndrome: Secondary | ICD-10-CM

## 2015-07-20 DIAGNOSIS — G253 Myoclonus: Secondary | ICD-10-CM | POA: Diagnosis not present

## 2015-07-20 MED ORDER — CARBIDOPA-LEVODOPA 25-100 MG PO TABS: 2.0000 | ORAL_TABLET | Freq: Three times a day (TID) | ORAL | Status: DC

## 2015-07-20 NOTE — Progress Notes (Signed)
Follow-up Visit   Date: 07/20/2015     Richard Blair MRN: [id number] DOB: [birth year]   Interim History: Richard Blair is a 81 y.o. right-handed Caucasian male with history of history of poliomyelitis with residual left leg weakness, pre-diabetes, hypertension, hyperlipidemia (off statins due to intolerance), CAD s/p CABG x 3 (2006), basal cell carcinoma scalp s/p resection, and colon cancer s/p resection (1984) returning to the clinic for follow-up of multiple systems atrophy.  The patient was accompanied to the clinic by daughter and wife who also provides collateral information.    History of present illness: He reports having polio involving the left leg when very he was very young child. He does not recall what age, but says that he wore a cast intermittently and underwent ankle surgery at the age of 74 for ?foot inversion. He was eventually able to stop wearing a brace, but always had atrophy and weakness of the left leg. Despite his weakness, he he played football in high school.   He was doing well until 2011 when he developed left foot drop and needed a left leg brace. In 2013, he started using a cane due to problems with balance, gait, and increased falls. He did physical therapy in 2013 which helped, again in fall 2014 and early 2015, but due to no improvement, he was discharged. He was active until last year where he was mowing the lawn with a push mower, but since then has become more sedentary due to gait instability. He is currently ambulating with a rollator and feels stable when using it but is very unsteady if he does not have support.  He has fallen once this year, but when symptoms were severe fell 4 times in 6-days. Most of the time when he falls, he falls backwards. He cannot stand up from falls and would have to call for help or go to furniture. No preceding lightheadedness, changes in vision, palpitations, or warning symptoms.   Upon further questioning, he has  slowed movements, speech is softer and slightly slurred, intermittent chocking spells with solids and liquids. There is no changes in handwriting, tremor, constipation, changes in small/taste, hallucinations, vivid creams, or acting out of his dreams.   He has been followed by Dr. Melrose Nakayama and was given a trial of sinemet 25/100 three times per day for one week, but due to no change in symptoms it was stopped.   - Follow-up 05/22/2014:  At his last visit sinemet 25/100 was started and he recently increased it to 2 tablets twice daily. Mr. Mellinger does not notice any significant difference, but his daughter and wife say that it has helped.  For instance, he fell forwards trying to pick something up off the floor and family say that he did not require as much help to get up as before.  Since March, he has only fallen twice which is improved.    - Follow-up 07/08/2014:  He underwent EMG of the left arm and leg which showed moderately severe length-dependent sensorimotor polyneuropathy as well as changes consistent with poliomyelitis involving the left leg.  He does not feel any significant improvement, since being on sinemet, but his wife feels that he is stronger.  He was able to walk about 12 feet without the walker, which he had not done in the past 29-months. He also mowed his lawn on a riding mower after his son helped him onto it, but felt quite tired by the end of it.  He has  lost about 3lb since April 2015 and attributes this to poor appetite.      - Follow-up 09/12/2014:  He has completed LSVT and reports improved strength.  He is able to support himself better and is able to stand for 2-min unassisted and longer with support.   UPDATE 01/16/2015: Up until a few weeks ago, he was standing up to 7-min, but has not been feeling well the past few days.  He has gained 3lb since adding ensure to his diet.    UPDATE 07/20/2015:  He had three interval falls, mostly during transfers.  He stopped doing his home  exercises several months ago.  He continues to use a walker, but really wishes he did not have to.  Again, he remains discouraged that his balance is terrible.  He denies any swallowing difficulties. His wife has noticed that his speech is worse than before.  No new weakness.    Medications:  Current Outpatient Prescriptions on File Prior to Visit  Medication Sig Dispense Refill  . aspirin 81 MG tablet Take 81 mg by mouth daily.    . carbidopa-levodopa (SINEMET IR) 25-100 MG per tablet TAKE TWO TABLETS BY MOUTH THREE TIMES DAILY 180 tablet 3  . Cholecalciferol (VITAMIN D-3) 1000 UNITS CAPS Take by mouth daily.    . fluticasone (FLONASE) 50 MCG/ACT nasal spray Place 2 sprays into both nostrils daily.    Marland Kitchen lisinopril (PRINIVIL,ZESTRIL) 10 MG tablet Take 10 mg by mouth daily.    . vitamin B-12 (CYANOCOBALAMIN) 1000 MCG tablet Take 1,000 mcg by mouth daily.     No current facility-administered medications on file prior to visit.    Allergies:  Allergies  Allergen Reactions  . Aleve [Naproxen Sodium]   . Crestor [Rosuvastatin]   . Lescol [Fluvastatin Sodium]   . Lipitor [Atorvastatin]      Review of Systems:  CONSTITUTIONAL: No fevers, chills, night sweats, or weight loss.   EYES: No visual changes or eye pain ENT: No hearing changes.  No history of nose bleeds.   RESPIRATORY: No cough, wheezing and shortness of breath.   CARDIOVASCULAR: Negative for chest pain, and palpitations.   GI: Negative for abdominal discomfort, blood in stools or black stools.  No recent change in bowel habits.   GU:  No history of incontinence.   MUSCLOSKELETAL: No history of joint pain or swelling.  No myalgias.   SKIN: Negative for lesions, rash, and itching.   ENDOCRINE: Negative for cold or heat intolerance, polydipsia or goiter.   PSYCH:  No depression or anxiety symptoms.   NEURO: As Above.   Vital Signs:  BP 158/80 mmHg  Pulse 90  Ht $R'5\' 5"'OQ$  (1.651 m)  Wt 144 lb (65.318 kg)  BMI 23.96 kg/m2   SpO2 97%  Neurological Exam: MENTAL STATUS including orientation to time, place, person, recent and remote memory, attention span and concentration, language, and fund of knowledge is normal.  Speech is soft and hypophonic.  Dysarthric at times also (new).  CRANIAL NERVES:   Pupils equal round and reactive to light. No square wave jerks. Vertical gaze paresis bilaterally especially with upward gaze.  Horizontal gaze is intact. Pursuit is slightly jerky and there is evidence of overshoot with saccadic eye movements.  Face is symmetric. Palate elevates symmetrically. Palatal myoclonus present.  Tongue is midline.  Tongue movements are moderately slowed and there is slight weakness.  Left palmomental is present and Myerson sign is positive.   MOTOR:  Motor strength is 5/5 upper  extremities, see below table for lower extremities.  Prominent atrophy of the left lower extremity especially below the knee. Tone is slightly increased in the RUE  >> LUE (1).  Right Lower Extremity:    Left Lower Extremity:    Hip flexors  5/5   Hip flexors  5/5   Hip extensors  5/5   Hip extensors  5-/5   Abductor  5/5   Abductor  5/5   Adductor  5/5   Adductor  5/5   Knee flexors  5/5   Knee flexors  4+/5   Knee extensors  5/5   Knee extensors  5-/5   Dorsiflexors  5/5   Dorsiflexors  1/5   Plantarflexors  5/5   Plantarflexors  3/5   Toe extensors  5/5   Toe extensors  0/5   Toe flexors  5/5   Toe flexors  1/5   Tone (Ashworth scale)  0   Tone (Ashworth scale)  0     MSRs:  Right      Left  brachioradialis  3+   brachioradialis  3+   Biceps  2+   biceps  2+   triceps  2+   triceps  2+   patellar  1+   patellar  0   ankle jerk  trace   ankle jerk  0   Hoffman  no   Hoffman  no   plantar response  down   plantar response  down    COORDINATION/GAIT: Mild dysmetria with finger to nose testing. Finger tapping and heel tapping shows slowed movements with decrement, worse on right. He is unable to rise from a chair  without using arms. Gait is moderately ataxic and assisted with rollator. He walks with small steps, no shuffling, but there is mild dragging of the feet.  Data: Labs 09/04/2013: B12 407  Labs 01/02/2014: HbA1c 6.3, Cr 0.9, Na 137, AST 14, ALT 12  Chol 143  TH 75  LDL 90.9, HDL 37 Labs 01/15/2014: CK 76, ESR 38, MG panel - neg Labs 04/02/2014:  Heavy metal screen neg, Mg 2.0, vitamin B6 7.7, copper 91, ceruloplasmin 25, RPR neg, CRP 1.8, folate 15.4, vitamin E 2.2, SPEP/UPEP with IFE no M protein, TSH 4.127  US carotids 04/07/2014: No hemodynamically significant stenosis   MRI/A brain wwo contrast 04/11/2014:  Atrophy and small vessel disease. No acute intracranial findings. No abnormal postcontrast enhancement. Potentially flow reducing 75% supraclinoid left ICA stenosis. No evidence for vertebrobasilar insufficiency.   EMG 06/05/2014: 1. There is electrophysiologic evidence of a generalized sensorimotor polyneuropathy, axon loss in type, affecting the right upper and lower extremity, conforming to a gradient pattern. Overall, these findings are moderately severe in degree electrically. 2. Severe chronic motor axon loss changes are seen affecting the left lower extremity suggestive of an intraspinal canal lesion (anterior horn cell, radiculopathy, etc) affecting the L3-S1 nerve roots/segments. These findings are most likely reflect sequela of known poliomyelitis. 3. A superimposed multilevel intraspinal canal lesion process affecting the right L3-L5 nerve roots/segments cannot be excluded.    IMPRESSION/PLAN: 1.  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  Dopa-responsiveness makes PSP possible also.  - Clinically slightly worse, as expected for disease progression.  Lengthy discussion on disease, management options, and prognosis  - Symptoms:  Progressive gait instability, dysarthria (worse), ataxia  - Exam:  palatal myotonus, hypophonia, dysarthria, peripheral  neuropathy, and gait ataxia.     Truncal ataxia improved after starting levadopa  -  Continue sinemet 25/100 2 tablets three times daily - refills provided  - Continue home PT exercises  - Strongly encouraged to use walker, fall precautions discussed at length since he prefers to use a cane over the walker, but I had mentioned that if walking continues to worsen, then he would need a wheelchair.  At this time, he appears stable with rollator.  2. Intracranial stenosis (LICA supraclinoid stenosis)  - Clinically stable  - Continue ASA $RemoveBefo'81mg'[id number]$  daily  - His LDL is 90 and has history of statin intolerance, so is current not on medications.    3.  Idiopathic peripheral neuropathy manifesting with gait ataxia and numbness, clinically stable  - EMG shows moderately severe length-dependent peripheral neuropathy  4.  Poliomyelitis with residual left leg weakness  5.  Return to clinic in 62-months    The duration of this appointment visit was 25 minutes of face-to-face time with the patient.  Greater than 50% of this time was spent in counseling, explanation of diagnosis, planning of further management, and coordination of care.   Thank you for allowing me to participate in patient's care.  If I can answer any additional questions, I would be pleased to do so.    Sincerely,    Donika K. Posey Pronto, DO

## 2015-07-20 NOTE — Patient Instructions (Signed)
Start your home exercises for your balance If falls worsen, call my office and we can send a home therapist to work with you Continue your medications as you are Continue to use a rollator Return to clinic in 6 months

## 2016-01-21 ENCOUNTER — Ambulatory Visit (INDEPENDENT_AMBULATORY_CARE_PROVIDER_SITE_OTHER): Payer: Medicare Other | Admitting: Neurology

## 2016-01-21 ENCOUNTER — Encounter: Payer: Self-pay | Admitting: Neurology

## 2016-01-21 VITALS — BP 140/80 | HR 94 | Ht 65.0 in | Wt 150.3 lb

## 2016-01-21 DIAGNOSIS — G238 Other specified degenerative diseases of basal ganglia: Secondary | ICD-10-CM

## 2016-01-21 DIAGNOSIS — R260 Ataxic gait: Secondary | ICD-10-CM

## 2016-01-21 DIAGNOSIS — Z9181 History of falling: Secondary | ICD-10-CM

## 2016-01-21 DIAGNOSIS — G903 Multi-system degeneration of the autonomic nervous system: Secondary | ICD-10-CM

## 2016-01-21 DIAGNOSIS — G14 Postpolio syndrome: Secondary | ICD-10-CM | POA: Diagnosis not present

## 2016-01-21 DIAGNOSIS — R269 Unspecified abnormalities of gait and mobility: Secondary | ICD-10-CM

## 2016-01-21 NOTE — Progress Notes (Signed)
Follow-up Visit   Date: 01/21/2016     Richard Blair MRN: [id number] DOB: 1926/05/22   Interim History: Richard Blair is a 82 y.o. right-handed Caucasian male with history of history of poliomyelitis with residual left leg weakness, pre-diabetes, hypertension, hyperlipidemia (off statins due to intolerance), CAD s/p CABG x 3 (2006), basal cell carcinoma scalp s/p resection, and colon cancer s/p resection (1984) returning to the clinic for follow-up of multiple system atrophy.  The patient was accompanied to the clinic by daughter who also provides collateral information.    History of present illness: He reports having polio involving the left leg when very he was very young child. He does not recall what age, but says that he wore a cast intermittently and underwent ankle surgery at the age of 31 for ?foot inversion. He was eventually able to stop wearing a brace, but always had atrophy and weakness of the left leg. Despite his weakness, he he played football in high school.   He was doing well until 2011 when he developed left foot drop and needed a left leg brace. In 2013, he started using a cane due to problems with balance, gait, and increased falls. He did physical therapy in 2013 which helped, again in fall 2014 and early 2015, but due to no improvement, he was discharged. He was active until last year where he was mowing the lawn with a push mower, but since then has become more sedentary due to gait instability. He is currently ambulating with a rollator and feels stable when using it but is very unsteady if he does not have support.  He has fallen once this year, but when symptoms were severe fell 4 times in 6-days. Most of the time when he falls, he falls backwards. He cannot stand up from falls and would have to call for help or go to furniture. No preceding lightheadedness, changes in vision, palpitations, or warning symptoms.   Upon further questioning, he has slowed  movements, speech is softer and slightly slurred, intermittent chocking spells with solids and liquids. There is no changes in handwriting, tremor, constipation, changes in small/taste, hallucinations, vivid creams, or acting out of his dreams.   He has been followed by Dr. Melrose Nakayama and was given a trial of sinemet 25/100 three times per day for one week, but due to no change in symptoms it was stopped.   - Follow-up 05/22/2014:  At his last visit sinemet 25/100 was started and he recently increased it to 2 tablets twice daily. Richard Blair does not notice any significant difference, but his daughter and wife say that it has helped.  For instance, he fell forwards trying to pick something up off the floor and family say that he did not require as much help to get up as before.  Since March, he has only fallen twice which is improved.    - Follow-up 09/12/2014:  He has completed LSVT and reports improved strength.  He is able to support himself better and is able to stand for 2-min unassisted and longer with support.   UPDATE 01/16/2015: Up until a few weeks ago, he was standing up to 7-min, but has not been feeling well the past few days.  He has gained 3lb since adding ensure to his diet.    UPDATE 01/21/2016:  He continues to have marked gait unsteadiness and now has been nearly completely dependent on using a rollator.  He has falls about once per month, mostly  when he is transferring and always when not using the rollator.  He does not do his home exercises and is not interested in PT.  He would like to stop drinking ensure, despite gaining weight since doing this.  He denies any swallowing problems or new weakness.  His wife did not make the appointment today because she was newly diagnosed with atrial fibrillation.    Medications:  Current Outpatient Prescriptions on File Prior to Visit  Medication Sig Dispense Refill  . aspirin 81 MG tablet Take 81 mg by mouth daily.    . carbidopa-levodopa (SINEMET IR)  25-100 MG per tablet Take 2 tablets by mouth 3 (three) times daily. 540 tablet 3  . Cholecalciferol (VITAMIN D-3) 1000 UNITS CAPS Take by mouth daily.    . fluticasone (FLONASE) 50 MCG/ACT nasal spray Place 2 sprays into both nostrils daily.    Marland Kitchen lisinopril (PRINIVIL,ZESTRIL) 10 MG tablet Take 10 mg by mouth daily.    . vitamin B-12 (CYANOCOBALAMIN) 1000 MCG tablet Take 1,000 mcg by mouth daily.     No current facility-administered medications on file prior to visit.    Allergies:  Allergies  Allergen Reactions  . Aleve [Naproxen Sodium]   . Crestor [Rosuvastatin]   . Fluvastatin Other (See Comments)    Problem with kidneys  . Lescol [Fluvastatin Sodium]   . Lipitor [Atorvastatin]      Review of Systems:  CONSTITUTIONAL: No fevers, chills, night sweats, or weight loss.   EYES: No visual changes or eye pain ENT: No hearing changes.  No history of nose bleeds.   RESPIRATORY: No cough, wheezing and shortness of breath.   CARDIOVASCULAR: Negative for chest pain, and palpitations.   GI: Negative for abdominal discomfort, blood in stools or black stools.  No recent change in bowel habits.   GU:  No history of incontinence.   MUSCLOSKELETAL: No history of joint pain or swelling.  No myalgias.   SKIN: Negative for lesions, rash, and itching.   ENDOCRINE: Negative for cold or heat intolerance, polydipsia or goiter.   PSYCH:  No depression or anxiety symptoms.   NEURO: As Above.   Vital Signs:  BP 140/80 mmHg  Pulse 94  Ht 5\' 5"  (1.651 m)  Wt 150 lb 5 oz (68.181 kg)  BMI 25.01 kg/m2  SpO2 95%  Neurological Exam: MENTAL STATUS including orientation to time, place, person, recent and remote memory, attention span and concentration, language, and fund of knowledge is normal.  Speech is soft and hypophonic and moderate dysarthric (worse)  CRANIAL NERVES:   Pupils equal round and reactive to light. No square wave jerks. Vertical gaze paresis bilaterally especially with upward gaze.   Horizontal gaze is intact.   Face is symmetric. Palate elevates symmetrically. Palatal myoclonus present.  Tongue is midline.  Tongue movements are moderately slowed and there is slight weakness.    MOTOR:  Motor strength is 5/5 upper extremities, see below table for lower extremities.  Prominent atrophy of the left lower extremity especially below the knee. Tone is slightly increased in the RUE  >> LUE (1).  Right Lower Extremity:    Left Lower Extremity:    Hip flexors  5/5   Hip flexors  5/5   Hip extensors  5/5   Hip extensors  5-/5   Abductor  5/5   Abductor  5/5   Adductor  5/5   Adductor  5/5   Knee flexors  5/5   Knee flexors  4+/5   Knee  extensors  5/5   Knee extensors  5-/5   Dorsiflexors  4+/5   Dorsiflexors  1/5   Plantarflexors  5-/5   Plantarflexors  3/5   Toe extensors  4+/5   Toe extensors  0/5   Toe flexors  5-/5   Toe flexors  1/5   Tone (Ashworth scale)  0   Tone (Ashworth scale)  0     MSRs:  Right      Left  brachioradialis  3+   brachioradialis  3+   Biceps  2+   biceps  2+   triceps  2+   triceps  2+   patellar  1+   patellar  0   ankle jerk  trace   ankle jerk  0   Hoffman  no   Hoffman  no   plantar response  down   plantar response  down    COORDINATION/GAIT: Mild dysmetria with finger to nose testing. Finger tapping and heel tapping shows slowed movements with decrement, worse on right. He is unable to rise from a chair without using arms. Gait is severely ataxic, almost tripped when turning, with dragging of the feet bilaterally.    Data: Labs 09/04/2013: B12 407  Labs 01/02/2014: HbA1c 6.3, Cr 0.9, Na 137, AST 14, ALT 12  Chol 143  TH 75  LDL 90.9, HDL 37 Labs 01/15/2014: CK 76, ESR 38, MG panel - neg Labs 04/02/2014:  Heavy metal screen neg, Mg 2.0, vitamin B6 7.7, copper 91, ceruloplasmin 25, RPR neg, CRP 1.8, folate 15.4, vitamin E 2.2, SPEP/UPEP with IFE no M protein, TSH 4.127  US carotids 04/07/2014: No hemodynamically significant stenosis   MRI/A  brain wwo contrast 04/11/2014:  Atrophy and small vessel disease. No acute intracranial findings. No abnormal postcontrast enhancement. Potentially flow reducing 75% supraclinoid left ICA stenosis. No evidence for vertebrobasilar insufficiency.   EMG 06/05/2014: 1. There is electrophysiologic evidence of a generalized sensorimotor polyneuropathy, axon loss in type, affecting the right upper and lower extremity, conforming to a gradient pattern. Overall, these findings are moderately severe in degree electrically. 2. Severe chronic motor axon loss changes are seen affecting the left lower extremity suggestive of an intraspinal canal lesion (anterior horn cell, radiculopathy, etc) affecting the L3-S1 nerve roots/segments. These findings are most likely reflect sequela of known poliomyelitis. 3. A superimposed multilevel intraspinal canal lesion process affecting the right L3-L5 nerve roots/segments cannot be excluded.    IMPRESSION/PLAN: 1.  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  Dopa-responsiveness makes PSP possible also.  - Clinically slightly worse, as expected for disease progression.  Lengthy discussion on disease, management options, and prognosis  - Symptoms:  Progressive gait instability, dysarthria (worse), ataxia  - Continue sinemet 25/100 2 tablets three times daily - refills provided  - I feel strongly that he needs a wheelchair or motorized chair for the best fall precautions because he does not appear stable with the rollator.  Patient does not want to escalate gait devices at this time.    - Home PT declined  2. Intracranial stenosis (LICA supraclinoid stenosis), stable on ASA. Intolerant of statin therapy.  3.  Idiopathic peripheral neuropathy (severe) manifesting with gait ataxia, distal weakness, and numbness, worsening  4.  Poliomyelitis with residual left leg weakness  Return to clinic in 1 year    The duration of this appointment  visit was 30 minutes of face-to-face time with the patient.  Greater than 50% of this time was spent  in counseling, explanation of diagnosis, planning of further management, and coordination of care.   Thank you for allowing me to participate in patient's care.  If I can answer any additional questions, I would be pleased to do so.    Sincerely,    Dystany Duffy K. Posey Pronto, DO

## 2016-01-21 NOTE — Patient Instructions (Signed)
Continue medications as you are taking them Keep using your rollator  Continue to drink 1 can of ensure daily Return to clinic 1-year

## 2016-04-18 ENCOUNTER — Telehealth: Payer: Self-pay | Admitting: Neurology

## 2016-04-18 NOTE — Telephone Encounter (Signed)
PT ordered.

## 2016-04-18 NOTE — Telephone Encounter (Signed)
Yes, please do.  I hope he is doing okay and not suffered injuries.    Felisia Balcom K. Posey Pronto, DO

## 2016-04-18 NOTE — Telephone Encounter (Signed)
I spoke with patient's daughter and she said that her dad has fallen about 6 times in the past 2 weeks.  He has finally agreed to start PT.  Ok to order home PT?

## 2016-04-18 NOTE — Telephone Encounter (Signed)
Richard Blair 11/27/1926. His daughter [name] called wanting to let you and Dr. Posey Pronto know that there have been some changes with her father. Her number is [phone]. Thank you

## 2016-05-19 ENCOUNTER — Other Ambulatory Visit: Payer: Self-pay | Admitting: Physician Assistant

## 2016-05-19 ENCOUNTER — Ambulatory Visit
Admission: RE | Admit: 2016-05-19 | Discharge: 2016-05-19 | Disposition: A | Discharge status: Home / Self Care | Payer: Medicare Other | Source: Ambulatory Visit | Attending: Physician Assistant | Admitting: Physician Assistant

## 2016-05-19 DIAGNOSIS — S0003XA Contusion of scalp, initial encounter: Secondary | ICD-10-CM | POA: Diagnosis not present

## 2016-05-19 DIAGNOSIS — S0990XA Unspecified injury of head, initial encounter: Secondary | ICD-10-CM | POA: Diagnosis present

## 2016-05-19 DIAGNOSIS — I998 Other disorder of circulatory system: Secondary | ICD-10-CM | POA: Diagnosis not present

## 2016-05-19 DIAGNOSIS — W010XXA Fall on same level from slipping, tripping and stumbling without subsequent striking against object, initial encounter: Secondary | ICD-10-CM | POA: Insufficient documentation

## 2016-07-05 ENCOUNTER — Other Ambulatory Visit: Payer: Self-pay | Admitting: Neurology

## 2016-07-05 NOTE — Telephone Encounter (Signed)
Rx sent 

## 2016-10-18 ENCOUNTER — Other Ambulatory Visit: Payer: Self-pay | Admitting: Neurology

## 2016-10-19 ENCOUNTER — Other Ambulatory Visit: Payer: Self-pay | Admitting: *Deleted

## 2016-10-19 MED ORDER — CARBIDOPA-LEVODOPA 25-100 MG PO TABS: 2.0000 | ORAL_TABLET | Freq: Three times a day (TID) | ORAL | 0 refills | Status: DC

## 2016-10-19 NOTE — Telephone Encounter (Signed)
Rx sent 

## 2017-01-08 ENCOUNTER — Other Ambulatory Visit: Payer: Self-pay | Admitting: Neurology

## 2017-01-20 ENCOUNTER — Ambulatory Visit: Payer: Medicare Other | Admitting: Neurology

## 2017-02-10 ENCOUNTER — Ambulatory Visit (INDEPENDENT_AMBULATORY_CARE_PROVIDER_SITE_OTHER): Payer: Medicare Other | Admitting: Neurology

## 2017-02-10 ENCOUNTER — Encounter: Payer: Self-pay | Admitting: Neurology

## 2017-02-10 VITALS — BP 140/82 | HR 85

## 2017-02-10 DIAGNOSIS — G903 Multi-system degeneration of the autonomic nervous system: Secondary | ICD-10-CM

## 2017-02-10 DIAGNOSIS — R260 Ataxic gait: Secondary | ICD-10-CM

## 2017-02-10 DIAGNOSIS — G238 Other specified degenerative diseases of basal ganglia: Secondary | ICD-10-CM

## 2017-02-10 MED ORDER — CARBIDOPA-LEVODOPA 25-100 MG PO TABS: 2.0000 | ORAL_TABLET | Freq: Three times a day (TID) | ORAL | 3 refills | Status: DC

## 2017-02-10 NOTE — Patient Instructions (Addendum)
1.  Recommend hospice care  2.  Start using a chin tuck position when swallowing 3.  Continue your medications  Return to clinic in 1 year

## 2017-02-10 NOTE — Progress Notes (Signed)
Follow-up Visit   Date: 02/10/17     Richard Blair MRN: [id number] DOB: June 26, 1926   Interim History: Richard Blair is a 83 y.o. right-handed Caucasian male with history of history of poliomyelitis with residual left leg weakness, pre-diabetes, hypertension, hyperlipidemia (off statins due to intolerance), CAD s/p CABG x 3 (2006), basal cell carcinoma scalp s/p resection, and colon cancer s/p resection (1984) returning to the clinic for follow-up of multiple system atrophy.  The patient was accompanied to the clinic by daughter and son who also provides collateral information.    History of present illness: He reports having polio involving the left leg when very he was very young child. He does not recall what age, but says that he wore a cast intermittently and underwent ankle surgery at the age of 39 for ?foot inversion. He was eventually able to stop wearing a brace, but always had atrophy and weakness of the left leg. Despite his weakness, he he played football in high school.   He was doing well until 2011 when he developed left foot drop and needed a left leg brace. In 2013, he started using a cane due to problems with balance, gait, and increased falls. He did physical therapy in 2013 which helped, again in fall 2014 and early 2015, but due to no improvement, he was discharged. He was active until last year where he was mowing the lawn with a push mower, but since then has become more sedentary due to gait instability. He is currently ambulating with a rollator and feels stable when using it but is very unsteady if he does not have support.  He has fallen once this year, but when symptoms were severe fell 4 times in 6-days. Most of the time when he falls, he falls backwards. He cannot stand up from falls and would have to call for help or go to furniture. No preceding lightheadedness, changes in vision, palpitations, or warning symptoms.   Upon further questioning, he has  slowed movements, speech is softer and slightly slurred, intermittent chocking spells with solids and liquids. There is no changes in handwriting, tremor, constipation, changes in small/taste, hallucinations, vivid creams, or acting out of his dreams.   He has been followed by Dr. Melrose Nakayama and was given a trial of sinemet 25/100 three times per day for one week, but due to no change in symptoms it was stopped.   He established care with me in April 2015 during which time I start sinemet 25/100 2 tablets twice daily. Richard Blair does not notice any significant difference, but his daughter and wife say that it has helped. He has completed LSVT and reports improved strength.  He is able to support himself better and is able to stand for 2-min unassisted and longer with support.    UPDATE 01/21/2016:  He continues to have marked gait unsteadiness and now has been nearly completely dependent on using a rollator.  He has falls about once per month, mostly when he is transferring and always when not using the rollator.  He does not do his home exercises and is not interested in PT.  He would like to stop drinking ensure, despite gaining weight since doing this.  He denies any swallowing problems or new weakness.  His wife did not make the appointment today because she was newly diagnosed with atrial fibrillation.   UPDATE 02/10/2017:  Over the past year, he has had gradual decline in independent functioning.  He is bathing,  feeding, and dressing himself, but unable to do any IADLs. He has a shower chair and shower bars.  He did speech therapy and had several spells of choking.  In fact, his son had to perform Heimlich maneuver on him twice. He is essentially non-ambulatory and requires assistance for transfers.  He uses a wheelchair for long distances, and only leaves his home for doctors appointments. He has fallen several times in the bathroom and does not wish to have any assistance.  His son has placed bars and railing  throughout the bathroom for support. He has Advanced Directives and is DNR/DNI. Unfortunately, his wife passed away in Jul 21, 2016.  He continues to live at home with 24-assistance from family.  He is established with palliative services ([facility]).  Medications:  Current Outpatient Prescriptions on File Prior to Visit  Medication Sig Dispense Refill  . aspirin 81 MG tablet Take 81 mg by mouth daily.    . Cholecalciferol (VITAMIN D-3) 1000 UNITS CAPS Take by mouth daily.    . fluticasone (FLONASE) 50 MCG/ACT nasal spray Place 2 sprays into both nostrils daily.    Marland Kitchen lisinopril (PRINIVIL,ZESTRIL) 10 MG tablet Take 10 mg by mouth daily.    . vitamin B-12 (CYANOCOBALAMIN) 1000 MCG tablet Take 1,000 mcg by mouth daily.     No current facility-administered medications on file prior to visit.     Allergies:  Allergies  Allergen Reactions  . Aleve [Naproxen Sodium]   . Crestor [Rosuvastatin]   . Fluvastatin Other (See Comments)    Problem with kidneys  . Lescol [Fluvastatin Sodium]   . Lipitor [Atorvastatin]      Review of Systems:  CONSTITUTIONAL: No fevers, chills, night sweats, or weight loss.   EYES: No visual changes or eye pain ENT: No hearing changes.  No history of nose bleeds.   RESPIRATORY: No cough, wheezing and shortness of breath.   CARDIOVASCULAR: Negative for chest pain, and palpitations.   GI: Negative for abdominal discomfort, blood in stools or black stools.  No recent change in bowel habits.   GU:  No history of incontinence.   MUSCLOSKELETAL: No history of joint pain or swelling.  No myalgias.   SKIN: Negative for lesions, rash, and itching.   ENDOCRINE: Negative for cold or heat intolerance, polydipsia or goiter.   PSYCH:  No depression or anxiety symptoms.   NEURO: As Above.   Vital Signs:  BP 140/82   Pulse 85   SpO2 98%   Neurological Exam: MENTAL STATUS including orientation to time, place, person, recent and remote memory, attention span and  concentration, language, and fund of knowledge is normal.  Speech is soft and hypophonic and severe dysarthria (worse, 20% intelligible speech)  CRANIAL NERVES:   Pupils equal round and reactive to light. No square wave jerks. Vertical gaze paresis bilaterally especially with upward gaze.  Horizontal gaze is intact.   Face is symmetric.  Tongue is midline.  Tongue movements are slowed and there is weakness.    MOTOR:  Motor strength is 5/5 upper extremities and proximal lower extremities.  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  Prominent atrophy of the left lower extremity especially below the knee. Tone is slightly increased in the RUE  >> LUE (1).  MSRs:  Right      Left  brachioradialis  2+   brachioradialis  2+   Biceps  2+   biceps  2+   triceps  2+   triceps  2+   patellar  1+   patellar  0    COORDINATION/GAIT: Mild dysmetria with finger to nose testing. Finger tapping and heel tapping shows slowed movements with decrement. He is unable to rise from a chair without using arms, so gait was not tested.  He is in a wheelchair today.  Data: Labs 09/04/2013: B12 407  Labs 01/02/2014: HbA1c 6.3, Cr 0.9, Na 137, AST 14, ALT 12  Chol 143  TH 75  LDL 90.9, HDL 37 Labs 01/15/2014: CK 76, ESR 38, MG panel - neg Labs 04/02/2014:  Heavy metal screen neg, Mg 2.0, vitamin B6 7.7, copper 91, ceruloplasmin 25, RPR neg, CRP 1.8, folate 15.4, vitamin E 2.2, SPEP/UPEP with IFE no M protein, TSH 4.127  US carotids 04/07/2014: No hemodynamically significant stenosis   MRI/A brain wwo contrast 04/11/2014:  Atrophy and small vessel disease. No acute intracranial findings. No abnormal postcontrast enhancement. Potentially flow reducing 75% supraclinoid left ICA stenosis. No evidence for vertebrobasilar insufficiency.   EMG 06/05/2014: 1. There is electrophysiologic evidence of a generalized sensorimotor polyneuropathy, axon loss in type, affecting the right upper and lower extremity,  conforming to a gradient pattern. Overall, these findings are moderately severe in degree electrically. 2. Severe chronic motor axon loss changes are seen affecting the left lower extremity suggestive of an intraspinal canal lesion (anterior horn cell, radiculopathy, etc) affecting the L3-S1 nerve roots/segments. These findings are most likely reflect sequela of known poliomyelitis. 3. A superimposed multilevel intraspinal canal lesion process affecting the right L3-L5 nerve roots/segments cannot be excluded.    IMPRESSION/PLAN: 1. 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   2. Poliomyelitis with residual left leg weakness 3. Idiopathic peripheral neuropathy  Clinically, there is evidence of progression of disease and now is non-ambulatory, has severe dysarthria, and dysphagia.  Lengthy discussion regarding goals of care and management options. He will be continued on sinemet 25/'100mg'$  2 tablets TID.  At this juncture, I he is appropriate for hospice especially with his increasing frequent choking spells and inability to perform basic ADLs safely.  Strategies to minimize aspiration was discussed.  Encouraged him to have soft diet and add more smoothies/shakes to supplement meals.  Home safety issues were addressed and I urged him to allow assistance for bathing as this seems to be the only location where he is falling now.  Advanced directives were discussed and he is DNR/DNI.    Return to clinic in 1 year   The duration of this appointment visit was 40 minutes of face-to-face time with the patient.  Greater than 50% of this time was spent in counseling, explanation of diagnosis, planning of further management, and coordination of care.   Thank you for allowing me to participate in patient's care.  If I can answer any additional questions, I would be pleased to do so.    Sincerely,    Omari Koslosky K. Posey Pronto, DO

## 2017-02-13 ENCOUNTER — Telehealth: Payer: Self-pay | Admitting: Neurology

## 2017-02-13 NOTE — Telephone Encounter (Signed)
Hospice referral faxed to [facility] at [phone] with confirmation received.

## 2017-06-30 ENCOUNTER — Telehealth: Payer: Self-pay | Admitting: *Deleted

## 2017-06-30 NOTE — Telephone Encounter (Signed)
Called Richard Blair back and gave her the instructions per Dr. Tomi Likens.  [phone])

## 2017-06-30 NOTE — Telephone Encounter (Signed)
Tina from Hospice called and said that patient has been having daily headaches.  They have started him on Topamax 25 mg bid but it is not helping.  They were wanting to know if we have another suggestion.

## 2017-06-30 NOTE — Telephone Encounter (Signed)
If he is taking nortriptyline, then he can just increase the nortriptyline and stop the topiramate.  If he is on 10mg , then increase to 25mg  at bedtime.  If he is on 25mg  at bedtime, then increase to 50mg  at bedtime.  If he is on 50mg , then increase to 100mg  at bedtime.

## 2017-06-30 NOTE — Telephone Encounter (Signed)
I called Otila Kluver back and gave her the instructions.  She also wanted to let you know that patient has also been started on nortriptyline.

## 2017-06-30 NOTE — Telephone Encounter (Signed)
Patient is on amitriptyline 25 mg.  (not nortriptyline)

## 2017-06-30 NOTE — Telephone Encounter (Signed)
I would increase amitriptyline to 50mg  at bedtime and he can discontinue topiramate (as the amitriptyline will treat the headache as well as the neck pain).

## 2017-06-30 NOTE — Telephone Encounter (Signed)
I would increase topiramate to 50mg  at bedtime.  Usually, I give the dose 4 weeks before considering any more changes.

## 2017-07-25 ENCOUNTER — Other Ambulatory Visit: Payer: Self-pay | Admitting: Neurology

## 2017-07-26 NOTE — Telephone Encounter (Signed)
Looks like hospice started patient on medication- but Dr. Tomi Likens increased.  Dr. Posey Pronto- please advise on refill.

## 2017-07-26 NOTE — Telephone Encounter (Signed)
OK to refill amitriptyline 50mg  qhs.  Can you call to see if headaches are improved?  We may consider reducing the dose, if they are better.  Thanks.

## 2017-07-31 ENCOUNTER — Other Ambulatory Visit: Payer: Self-pay

## 2017-07-31 MED ORDER — AMITRIPTYLINE HCL 50 MG PO TABS: 50.0000 mg | ORAL_TABLET | Freq: Every day | ORAL | 2 refills | Status: AC

## 2017-12-16 ENCOUNTER — Other Ambulatory Visit: Payer: Self-pay | Admitting: Neurology

## 2017-12-18 ENCOUNTER — Other Ambulatory Visit: Payer: Self-pay | Admitting: *Deleted

## 2017-12-18 MED ORDER — CARBIDOPA-LEVODOPA 25-100 MG PO TABS: 2.0000 | ORAL_TABLET | Freq: Three times a day (TID) | ORAL | 0 refills | Status: AC

## 2017-12-24 IMAGING — CT CT HEAD W/O CM
1 series · 14 of 29 positions shown, 18 images · non-contrast
Comparison: MR brain of 04/11/2014

CLINICAL DATA: Tripped and fell 2 days ago with facial trauma,
headache

EXAM:
CT HEAD WITHOUT CONTRAST
TECHNIQUE: Contiguous axial images were obtained from the base of the skull
through the vertex without intravenous contrast.

[Series 2: head wo · axial · 0.38mm/px · z∈[-51,+78]mm · 14 of 29 slices shown, 18 images]
[im 2/29  brain]
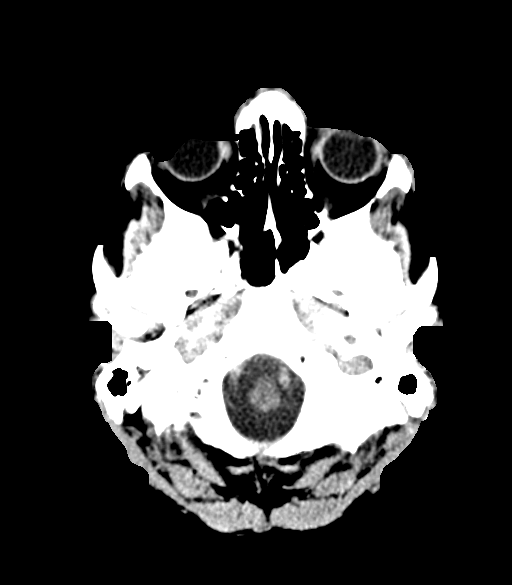
[im 2/29  bone]
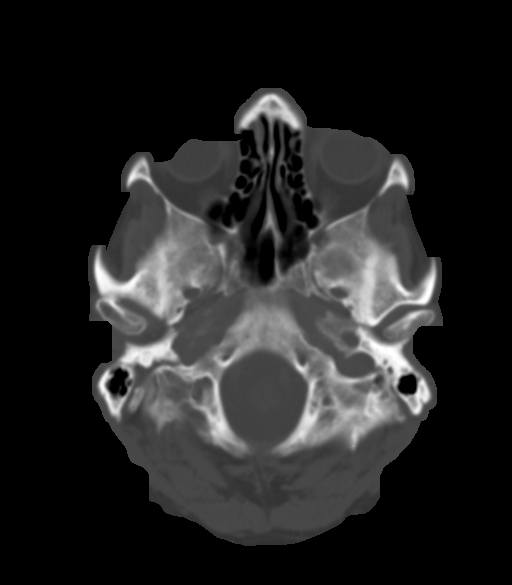
[im 4/29  brain]
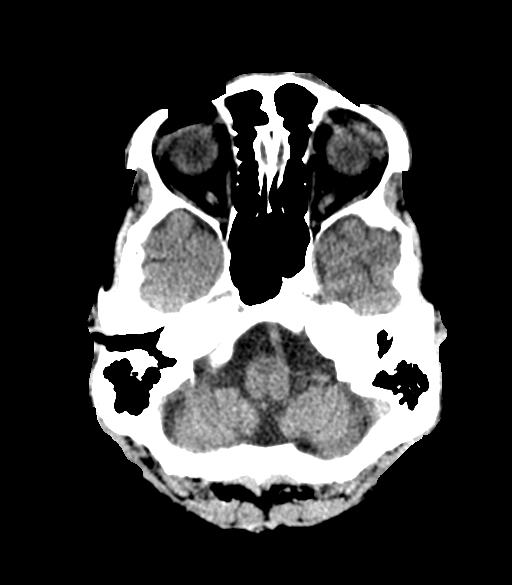
[im 6/29  brain]
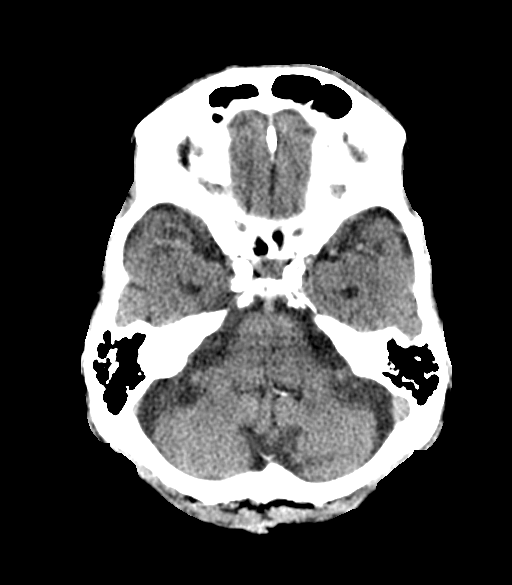
[im 8/29  brain]
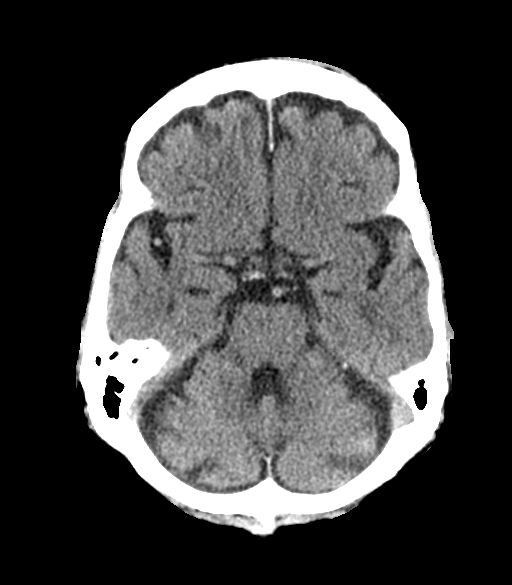
[im 10/29  brain]
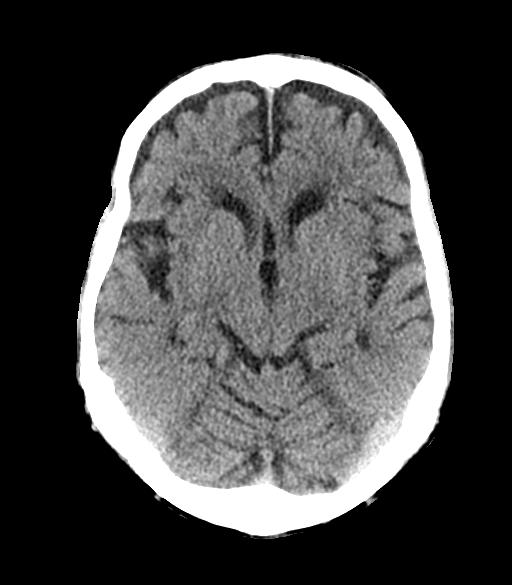
[im 10/29  bone]
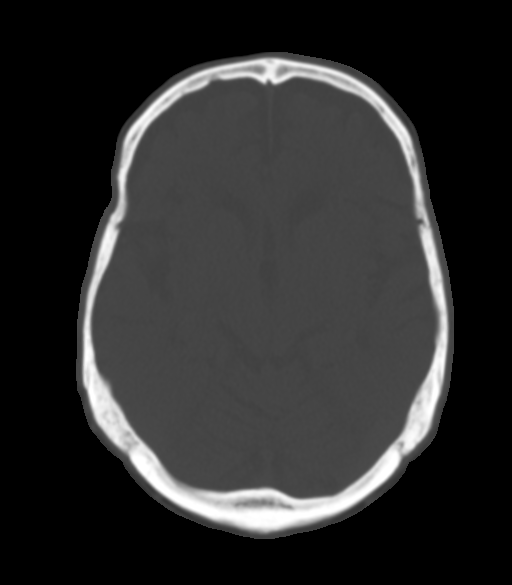
[im 12/29  brain]
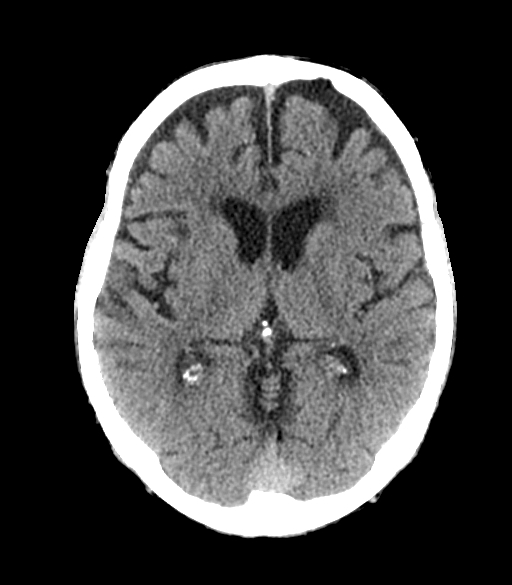
[im 14/29  brain]
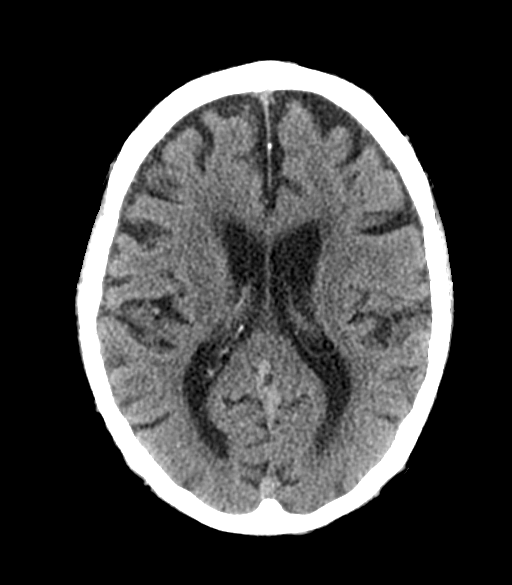
[im 16/29  brain]
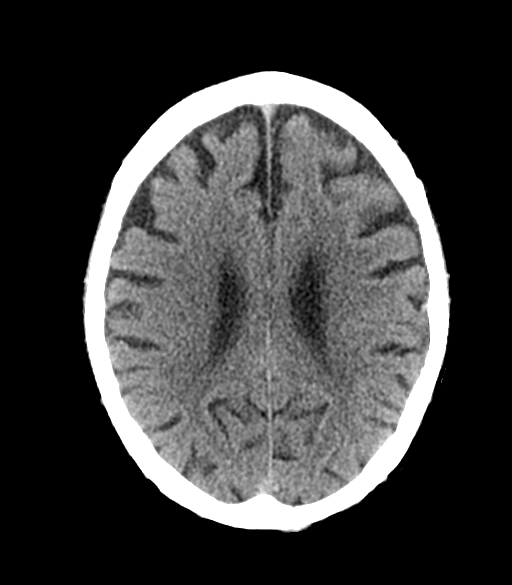
[im 18/29  brain]
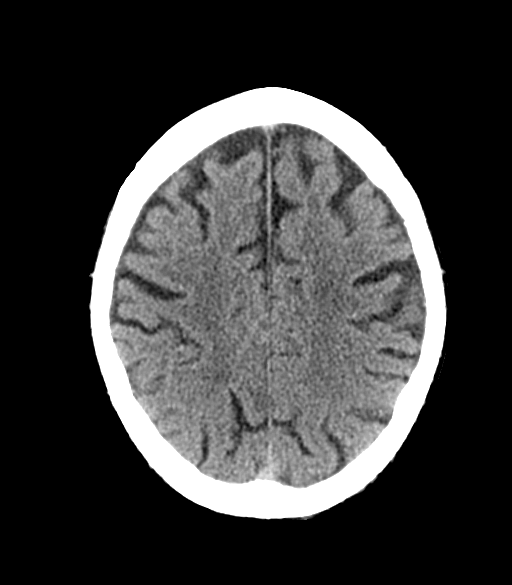
[im 18/29  bone]
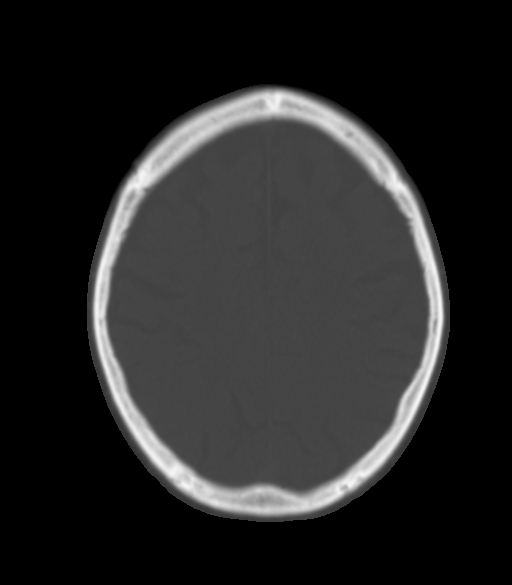
[im 20/29  brain]
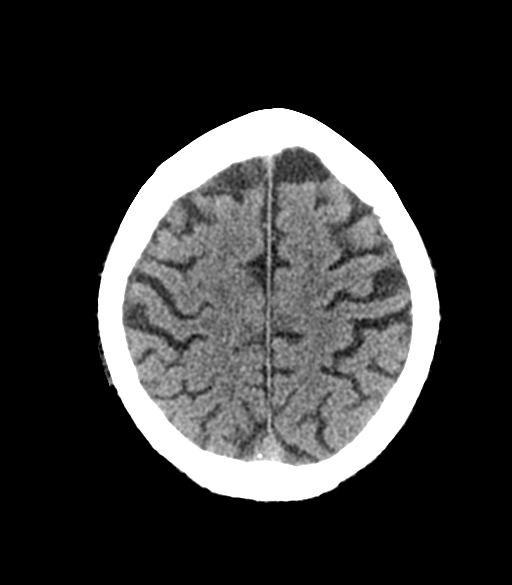
[im 22/29  brain]
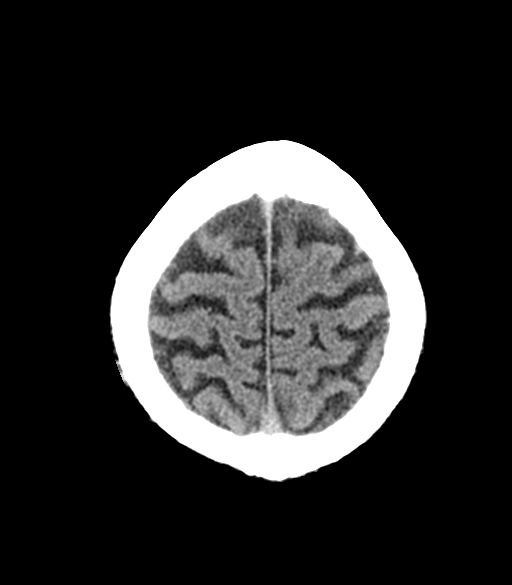
[im 24/29  brain]
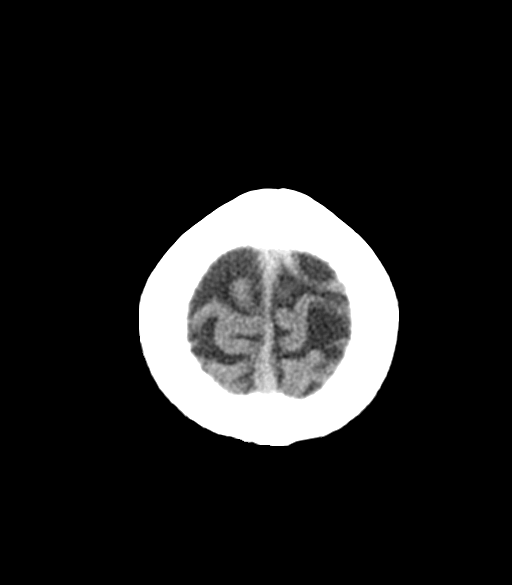
[im 26/29  brain]
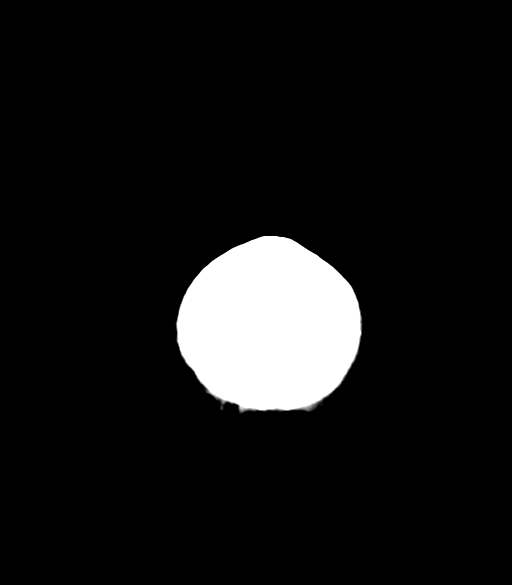
[im 26/29  bone]
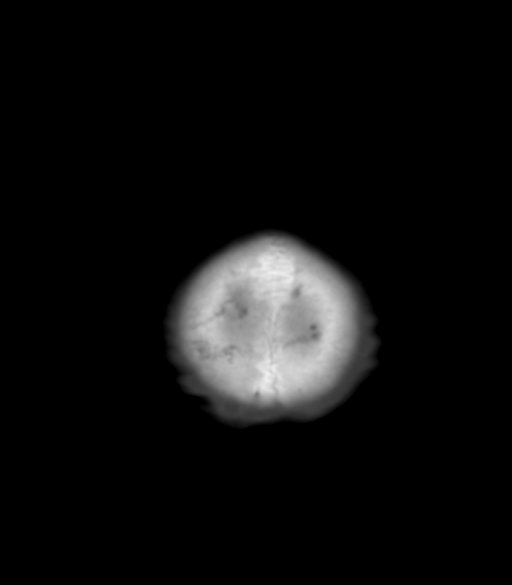
[im 28/29  brain]
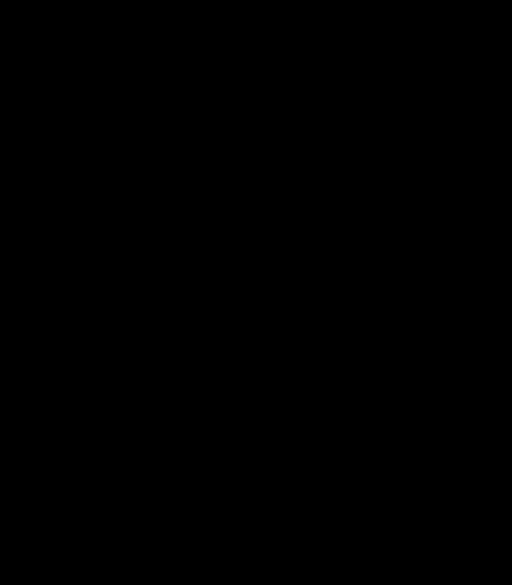

[14 of 29 positions shown; findings below may reference images not displayed]

FINDINGS: The ventricular system is prominent, as are the cortical sulci and
interhemispheric fissure consistent with diffuse atrophy. Mild to
moderate small vessel ischemic changes noted throughout the
periventricular white matter. No hemorrhage, mass lesion, or acute
infarction is seen. On soft tissue window images there is a left
frontal scalp hematoma present. No calvarial fracture is seen. The
paranasal sinuses that are visualized are well pneumatized.
IMPRESSION: 1. Diffuse atrophy and moderate small vessel ischemic change. No
acute intracranial abnormality.
2. Left frontal scalp hematoma.  No fracture.

## 2017-12-27 ENCOUNTER — Other Ambulatory Visit: Payer: Self-pay | Admitting: Neurology

## 2018-02-16 DEATH — deceased
